# Patient Record
Sex: Female | Born: 1950
Health system: Southern US, Community
[De-identification: ages and names within clinical notes are randomized; demographics above are authoritative.]

## PROBLEM LIST (undated history)

## (undated) DIAGNOSIS — E785 Hyperlipidemia, unspecified: Secondary | ICD-10-CM

## (undated) DIAGNOSIS — Z9289 Personal history of other medical treatment: Secondary | ICD-10-CM

## (undated) DIAGNOSIS — C801 Malignant (primary) neoplasm, unspecified: Secondary | ICD-10-CM

## (undated) DIAGNOSIS — I219 Acute myocardial infarction, unspecified: Secondary | ICD-10-CM

## (undated) DIAGNOSIS — A419 Sepsis, unspecified organism: Secondary | ICD-10-CM

## (undated) DIAGNOSIS — T7840XA Allergy, unspecified, initial encounter: Secondary | ICD-10-CM

## (undated) DIAGNOSIS — I255 Ischemic cardiomyopathy: Secondary | ICD-10-CM

## (undated) DIAGNOSIS — H409 Unspecified glaucoma: Secondary | ICD-10-CM

## (undated) DIAGNOSIS — E119 Type 2 diabetes mellitus without complications: Secondary | ICD-10-CM

## (undated) DIAGNOSIS — I2510 Atherosclerotic heart disease of native coronary artery without angina pectoris: Secondary | ICD-10-CM

## (undated) DIAGNOSIS — M1990 Unspecified osteoarthritis, unspecified site: Secondary | ICD-10-CM

## (undated) DIAGNOSIS — I509 Heart failure, unspecified: Secondary | ICD-10-CM

## (undated) DIAGNOSIS — N189 Chronic kidney disease, unspecified: Secondary | ICD-10-CM

## (undated) DIAGNOSIS — I10 Essential (primary) hypertension: Secondary | ICD-10-CM

## (undated) HISTORY — DX: Unspecified osteoarthritis, unspecified site: M19.90

## (undated) HISTORY — PX: MOHS SURGERY: SUR867

## (undated) HISTORY — DX: Type 2 diabetes mellitus without complications: E11.9

## (undated) HISTORY — DX: Allergy, unspecified, initial encounter: T78.40XA

## (undated) HISTORY — PX: CARPAL TUNNEL RELEASE: SHX101

## (undated) HISTORY — PX: TONSILLECTOMY: SUR1361

## (undated) HISTORY — PX: CATARACT EXTRACTION: SUR2

## (undated) HISTORY — PX: APPENDECTOMY: SHX54

## (undated) HISTORY — DX: Unspecified glaucoma: H40.9

## (undated) HISTORY — PX: BREAST BIOPSY: SHX20

---

## 2004-11-10 ENCOUNTER — Ambulatory Visit: Payer: Self-pay | Admitting: Unknown Physician Specialty

## 2005-11-16 ENCOUNTER — Ambulatory Visit: Payer: Self-pay | Admitting: Unknown Physician Specialty

## 2007-01-17 ENCOUNTER — Ambulatory Visit: Payer: Self-pay | Admitting: Unknown Physician Specialty

## 2007-03-09 ENCOUNTER — Encounter: Payer: Self-pay | Admitting: Orthopedic Surgery

## 2007-03-16 ENCOUNTER — Encounter: Payer: Self-pay | Admitting: Orthopedic Surgery

## 2007-04-16 ENCOUNTER — Encounter: Payer: Self-pay | Admitting: Orthopedic Surgery

## 2007-05-16 ENCOUNTER — Encounter: Payer: Self-pay | Admitting: Orthopedic Surgery

## 2007-12-26 ENCOUNTER — Ambulatory Visit: Payer: Self-pay | Admitting: Ophthalmology

## 2008-01-04 ENCOUNTER — Encounter: Payer: Self-pay | Admitting: General Practice

## 2008-01-14 ENCOUNTER — Encounter: Payer: Self-pay | Admitting: General Practice

## 2008-02-06 ENCOUNTER — Inpatient Hospital Stay: Payer: Self-pay | Admitting: Internal Medicine

## 2008-02-06 ENCOUNTER — Other Ambulatory Visit: Payer: Self-pay

## 2008-02-14 ENCOUNTER — Encounter: Payer: Self-pay | Admitting: General Practice

## 2008-03-14 ENCOUNTER — Encounter: Payer: Self-pay | Admitting: Internal Medicine

## 2008-03-15 ENCOUNTER — Encounter: Payer: Self-pay | Admitting: Internal Medicine

## 2008-04-15 ENCOUNTER — Encounter: Payer: Self-pay | Admitting: Internal Medicine

## 2008-05-15 ENCOUNTER — Encounter: Payer: Self-pay | Admitting: Internal Medicine

## 2008-05-31 ENCOUNTER — Ambulatory Visit: Payer: Self-pay | Admitting: Internal Medicine

## 2009-06-20 ENCOUNTER — Ambulatory Visit: Payer: Self-pay | Admitting: Internal Medicine

## 2010-06-24 ENCOUNTER — Ambulatory Visit: Payer: Self-pay | Admitting: Internal Medicine

## 2011-06-26 ENCOUNTER — Encounter (INDEPENDENT_AMBULATORY_CARE_PROVIDER_SITE_OTHER): Payer: BC Managed Care – PPO | Admitting: Ophthalmology

## 2011-06-26 DIAGNOSIS — E1139 Type 2 diabetes mellitus with other diabetic ophthalmic complication: Secondary | ICD-10-CM

## 2011-06-26 DIAGNOSIS — H43819 Vitreous degeneration, unspecified eye: Secondary | ICD-10-CM

## 2011-06-26 DIAGNOSIS — H35379 Puckering of macula, unspecified eye: Secondary | ICD-10-CM

## 2011-06-26 DIAGNOSIS — E11359 Type 2 diabetes mellitus with proliferative diabetic retinopathy without macular edema: Secondary | ICD-10-CM

## 2011-08-26 ENCOUNTER — Ambulatory Visit: Payer: Self-pay | Admitting: Internal Medicine

## 2011-12-16 ENCOUNTER — Ambulatory Visit (INDEPENDENT_AMBULATORY_CARE_PROVIDER_SITE_OTHER): Payer: BC Managed Care – PPO | Admitting: Ophthalmology

## 2012-06-15 HISTORY — PX: COLONOSCOPY: SHX174

## 2012-10-07 ENCOUNTER — Ambulatory Visit: Payer: Self-pay | Admitting: Internal Medicine

## 2013-06-15 HISTORY — PX: CARDIAC CATHETERIZATION: SHX172

## 2013-11-01 ENCOUNTER — Ambulatory Visit: Payer: Self-pay | Admitting: Internal Medicine

## 2014-03-15 ENCOUNTER — Ambulatory Visit: Payer: Self-pay | Admitting: Cardiology

## 2014-10-17 ENCOUNTER — Other Ambulatory Visit: Payer: Self-pay

## 2014-10-17 DIAGNOSIS — Z1231 Encounter for screening mammogram for malignant neoplasm of breast: Secondary | ICD-10-CM

## 2014-11-06 ENCOUNTER — Ambulatory Visit
Admission: RE | Admit: 2014-11-06 | Discharge: 2014-11-06 | Disposition: A | Discharge status: Home / Self Care | Payer: PPO | Source: Ambulatory Visit | Attending: Internal Medicine | Admitting: Internal Medicine

## 2014-11-06 DIAGNOSIS — Z1231 Encounter for screening mammogram for malignant neoplasm of breast: Secondary | ICD-10-CM

## 2014-11-06 HISTORY — DX: Malignant (primary) neoplasm, unspecified: C80.1

## 2014-11-16 ENCOUNTER — Telehealth: Payer: Self-pay | Admitting: Radiology

## 2015-07-05 DIAGNOSIS — E1169 Type 2 diabetes mellitus with other specified complication: Secondary | ICD-10-CM | POA: Diagnosis not present

## 2015-07-05 DIAGNOSIS — E785 Hyperlipidemia, unspecified: Secondary | ICD-10-CM | POA: Diagnosis not present

## 2015-07-05 DIAGNOSIS — I10 Essential (primary) hypertension: Secondary | ICD-10-CM | POA: Diagnosis not present

## 2015-07-05 DIAGNOSIS — Z9889 Other specified postprocedural states: Secondary | ICD-10-CM | POA: Diagnosis not present

## 2015-07-05 DIAGNOSIS — I2510 Atherosclerotic heart disease of native coronary artery without angina pectoris: Secondary | ICD-10-CM | POA: Diagnosis not present

## 2015-07-05 DIAGNOSIS — I5021 Acute systolic (congestive) heart failure: Secondary | ICD-10-CM | POA: Diagnosis not present

## 2015-07-05 DIAGNOSIS — E1122 Type 2 diabetes mellitus with diabetic chronic kidney disease: Secondary | ICD-10-CM | POA: Diagnosis not present

## 2015-07-05 DIAGNOSIS — N183 Chronic kidney disease, stage 3 (moderate): Secondary | ICD-10-CM | POA: Diagnosis not present

## 2015-07-16 DIAGNOSIS — I2510 Atherosclerotic heart disease of native coronary artery without angina pectoris: Secondary | ICD-10-CM | POA: Diagnosis not present

## 2015-07-16 DIAGNOSIS — N183 Chronic kidney disease, stage 3 (moderate): Secondary | ICD-10-CM | POA: Diagnosis not present

## 2015-07-16 DIAGNOSIS — E1122 Type 2 diabetes mellitus with diabetic chronic kidney disease: Secondary | ICD-10-CM | POA: Diagnosis not present

## 2015-07-23 DIAGNOSIS — E1122 Type 2 diabetes mellitus with diabetic chronic kidney disease: Secondary | ICD-10-CM | POA: Diagnosis not present

## 2015-07-23 DIAGNOSIS — I10 Essential (primary) hypertension: Secondary | ICD-10-CM | POA: Diagnosis not present

## 2015-07-23 DIAGNOSIS — E785 Hyperlipidemia, unspecified: Secondary | ICD-10-CM | POA: Diagnosis not present

## 2015-07-23 DIAGNOSIS — I5022 Chronic systolic (congestive) heart failure: Secondary | ICD-10-CM | POA: Diagnosis not present

## 2015-07-23 DIAGNOSIS — N183 Chronic kidney disease, stage 3 (moderate): Secondary | ICD-10-CM | POA: Diagnosis not present

## 2015-07-23 DIAGNOSIS — E1169 Type 2 diabetes mellitus with other specified complication: Secondary | ICD-10-CM | POA: Diagnosis not present

## 2015-08-08 DIAGNOSIS — L570 Actinic keratosis: Secondary | ICD-10-CM | POA: Diagnosis not present

## 2015-08-08 DIAGNOSIS — L578 Other skin changes due to chronic exposure to nonionizing radiation: Secondary | ICD-10-CM | POA: Diagnosis not present

## 2015-08-08 DIAGNOSIS — Z85828 Personal history of other malignant neoplasm of skin: Secondary | ICD-10-CM | POA: Diagnosis not present

## 2015-08-27 DIAGNOSIS — R6889 Other general symptoms and signs: Secondary | ICD-10-CM | POA: Diagnosis not present

## 2015-09-10 DIAGNOSIS — L570 Actinic keratosis: Secondary | ICD-10-CM | POA: Diagnosis not present

## 2015-10-23 DIAGNOSIS — L570 Actinic keratosis: Secondary | ICD-10-CM | POA: Diagnosis not present

## 2015-10-23 DIAGNOSIS — L718 Other rosacea: Secondary | ICD-10-CM | POA: Diagnosis not present

## 2015-10-23 DIAGNOSIS — Z85828 Personal history of other malignant neoplasm of skin: Secondary | ICD-10-CM | POA: Diagnosis not present

## 2015-10-23 DIAGNOSIS — L578 Other skin changes due to chronic exposure to nonionizing radiation: Secondary | ICD-10-CM | POA: Diagnosis not present

## 2015-10-24 ENCOUNTER — Other Ambulatory Visit: Payer: Self-pay | Admitting: Internal Medicine

## 2015-10-24 DIAGNOSIS — Z1231 Encounter for screening mammogram for malignant neoplasm of breast: Secondary | ICD-10-CM

## 2015-10-29 DIAGNOSIS — E113593 Type 2 diabetes mellitus with proliferative diabetic retinopathy without macular edema, bilateral: Secondary | ICD-10-CM | POA: Diagnosis not present

## 2015-11-07 ENCOUNTER — Ambulatory Visit
Admission: RE | Admit: 2015-11-07 | Discharge: 2015-11-07 | Disposition: A | Discharge status: Home / Self Care | Payer: PPO | Source: Ambulatory Visit | Attending: Internal Medicine | Admitting: Internal Medicine

## 2015-11-07 DIAGNOSIS — Z1231 Encounter for screening mammogram for malignant neoplasm of breast: Secondary | ICD-10-CM

## 2015-11-14 DIAGNOSIS — N183 Chronic kidney disease, stage 3 (moderate): Secondary | ICD-10-CM | POA: Diagnosis not present

## 2015-11-14 DIAGNOSIS — E1169 Type 2 diabetes mellitus with other specified complication: Secondary | ICD-10-CM | POA: Diagnosis not present

## 2015-11-14 DIAGNOSIS — E1122 Type 2 diabetes mellitus with diabetic chronic kidney disease: Secondary | ICD-10-CM | POA: Diagnosis not present

## 2015-11-14 DIAGNOSIS — E785 Hyperlipidemia, unspecified: Secondary | ICD-10-CM | POA: Diagnosis not present

## 2015-11-21 DIAGNOSIS — E785 Hyperlipidemia, unspecified: Secondary | ICD-10-CM | POA: Diagnosis not present

## 2015-11-21 DIAGNOSIS — E1169 Type 2 diabetes mellitus with other specified complication: Secondary | ICD-10-CM | POA: Diagnosis not present

## 2015-11-21 DIAGNOSIS — N183 Chronic kidney disease, stage 3 (moderate): Secondary | ICD-10-CM | POA: Diagnosis not present

## 2015-11-21 DIAGNOSIS — E1122 Type 2 diabetes mellitus with diabetic chronic kidney disease: Secondary | ICD-10-CM | POA: Diagnosis not present

## 2015-11-21 DIAGNOSIS — I10 Essential (primary) hypertension: Secondary | ICD-10-CM | POA: Diagnosis not present

## 2015-11-21 DIAGNOSIS — I2510 Atherosclerotic heart disease of native coronary artery without angina pectoris: Secondary | ICD-10-CM | POA: Diagnosis not present

## 2015-11-21 DIAGNOSIS — I5022 Chronic systolic (congestive) heart failure: Secondary | ICD-10-CM | POA: Diagnosis not present

## 2015-11-25 DIAGNOSIS — E113593 Type 2 diabetes mellitus with proliferative diabetic retinopathy without macular edema, bilateral: Secondary | ICD-10-CM | POA: Diagnosis not present

## 2015-12-11 DIAGNOSIS — L570 Actinic keratosis: Secondary | ICD-10-CM | POA: Diagnosis not present

## 2016-01-09 DIAGNOSIS — E785 Hyperlipidemia, unspecified: Secondary | ICD-10-CM | POA: Diagnosis not present

## 2016-01-09 DIAGNOSIS — N183 Chronic kidney disease, stage 3 (moderate): Secondary | ICD-10-CM | POA: Diagnosis not present

## 2016-01-09 DIAGNOSIS — Z9889 Other specified postprocedural states: Secondary | ICD-10-CM | POA: Diagnosis not present

## 2016-01-09 DIAGNOSIS — I10 Essential (primary) hypertension: Secondary | ICD-10-CM | POA: Diagnosis not present

## 2016-01-09 DIAGNOSIS — E1169 Type 2 diabetes mellitus with other specified complication: Secondary | ICD-10-CM | POA: Diagnosis not present

## 2016-01-09 DIAGNOSIS — I2510 Atherosclerotic heart disease of native coronary artery without angina pectoris: Secondary | ICD-10-CM | POA: Diagnosis not present

## 2016-01-09 DIAGNOSIS — E1122 Type 2 diabetes mellitus with diabetic chronic kidney disease: Secondary | ICD-10-CM | POA: Diagnosis not present

## 2016-01-09 DIAGNOSIS — I5022 Chronic systolic (congestive) heart failure: Secondary | ICD-10-CM | POA: Diagnosis not present

## 2016-01-23 DIAGNOSIS — L820 Inflamed seborrheic keratosis: Secondary | ICD-10-CM | POA: Diagnosis not present

## 2016-01-23 DIAGNOSIS — L570 Actinic keratosis: Secondary | ICD-10-CM | POA: Diagnosis not present

## 2016-01-23 DIAGNOSIS — D1800 Hemangioma unspecified site: Secondary | ICD-10-CM | POA: Diagnosis not present

## 2016-01-23 DIAGNOSIS — L821 Other seborrheic keratosis: Secondary | ICD-10-CM | POA: Diagnosis not present

## 2016-01-23 DIAGNOSIS — Z85828 Personal history of other malignant neoplasm of skin: Secondary | ICD-10-CM | POA: Diagnosis not present

## 2016-01-23 DIAGNOSIS — L578 Other skin changes due to chronic exposure to nonionizing radiation: Secondary | ICD-10-CM | POA: Diagnosis not present

## 2016-01-29 ENCOUNTER — Ambulatory Visit
Admission: RE | Admit: 2016-01-29 | Discharge: 2016-01-29 | Disposition: A | Discharge status: Home / Self Care | Payer: PPO | Source: Ambulatory Visit | Attending: Internal Medicine | Admitting: Internal Medicine

## 2016-01-29 ENCOUNTER — Other Ambulatory Visit: Payer: Self-pay | Admitting: Internal Medicine

## 2016-01-29 DIAGNOSIS — I639 Cerebral infarction, unspecified: Secondary | ICD-10-CM | POA: Diagnosis not present

## 2016-01-29 DIAGNOSIS — R270 Ataxia, unspecified: Secondary | ICD-10-CM | POA: Diagnosis not present

## 2016-01-29 DIAGNOSIS — G319 Degenerative disease of nervous system, unspecified: Secondary | ICD-10-CM | POA: Insufficient documentation

## 2016-01-29 DIAGNOSIS — I638 Other cerebral infarction: Secondary | ICD-10-CM | POA: Diagnosis not present

## 2016-02-27 DIAGNOSIS — L570 Actinic keratosis: Secondary | ICD-10-CM | POA: Diagnosis not present

## 2016-04-28 DIAGNOSIS — H401134 Primary open-angle glaucoma, bilateral, indeterminate stage: Secondary | ICD-10-CM | POA: Diagnosis not present

## 2016-04-29 DIAGNOSIS — L578 Other skin changes due to chronic exposure to nonionizing radiation: Secondary | ICD-10-CM | POA: Diagnosis not present

## 2016-04-29 DIAGNOSIS — Z85828 Personal history of other malignant neoplasm of skin: Secondary | ICD-10-CM | POA: Diagnosis not present

## 2016-04-29 DIAGNOSIS — L812 Freckles: Secondary | ICD-10-CM | POA: Diagnosis not present

## 2016-04-29 DIAGNOSIS — L570 Actinic keratosis: Secondary | ICD-10-CM | POA: Diagnosis not present

## 2016-04-29 DIAGNOSIS — D1800 Hemangioma unspecified site: Secondary | ICD-10-CM | POA: Diagnosis not present

## 2016-04-29 DIAGNOSIS — L299 Pruritus, unspecified: Secondary | ICD-10-CM | POA: Diagnosis not present

## 2016-05-05 DIAGNOSIS — H401134 Primary open-angle glaucoma, bilateral, indeterminate stage: Secondary | ICD-10-CM | POA: Diagnosis not present

## 2016-05-12 DIAGNOSIS — H18002 Unspecified corneal deposit, left eye: Secondary | ICD-10-CM | POA: Diagnosis not present

## 2016-05-15 DIAGNOSIS — E113593 Type 2 diabetes mellitus with proliferative diabetic retinopathy without macular edema, bilateral: Secondary | ICD-10-CM | POA: Diagnosis not present

## 2016-05-26 DIAGNOSIS — H18002 Unspecified corneal deposit, left eye: Secondary | ICD-10-CM | POA: Diagnosis not present

## 2016-05-28 DIAGNOSIS — J069 Acute upper respiratory infection, unspecified: Secondary | ICD-10-CM | POA: Diagnosis not present

## 2016-05-28 DIAGNOSIS — E1122 Type 2 diabetes mellitus with diabetic chronic kidney disease: Secondary | ICD-10-CM | POA: Diagnosis not present

## 2016-05-28 DIAGNOSIS — N183 Chronic kidney disease, stage 3 (moderate): Secondary | ICD-10-CM | POA: Diagnosis not present

## 2016-05-28 DIAGNOSIS — I5022 Chronic systolic (congestive) heart failure: Secondary | ICD-10-CM | POA: Diagnosis not present

## 2016-05-29 DIAGNOSIS — L570 Actinic keratosis: Secondary | ICD-10-CM | POA: Diagnosis not present

## 2016-06-01 DIAGNOSIS — R05 Cough: Secondary | ICD-10-CM | POA: Diagnosis not present

## 2016-06-01 DIAGNOSIS — N183 Chronic kidney disease, stage 3 (moderate): Secondary | ICD-10-CM | POA: Diagnosis not present

## 2016-06-01 DIAGNOSIS — J069 Acute upper respiratory infection, unspecified: Secondary | ICD-10-CM | POA: Diagnosis not present

## 2016-06-01 DIAGNOSIS — E1122 Type 2 diabetes mellitus with diabetic chronic kidney disease: Secondary | ICD-10-CM | POA: Diagnosis not present

## 2016-06-03 DIAGNOSIS — I2510 Atherosclerotic heart disease of native coronary artery without angina pectoris: Secondary | ICD-10-CM | POA: Diagnosis not present

## 2016-06-03 DIAGNOSIS — E785 Hyperlipidemia, unspecified: Secondary | ICD-10-CM | POA: Diagnosis not present

## 2016-06-03 DIAGNOSIS — E1122 Type 2 diabetes mellitus with diabetic chronic kidney disease: Secondary | ICD-10-CM | POA: Diagnosis not present

## 2016-06-03 DIAGNOSIS — N183 Chronic kidney disease, stage 3 (moderate): Secondary | ICD-10-CM | POA: Diagnosis not present

## 2016-06-03 DIAGNOSIS — E1169 Type 2 diabetes mellitus with other specified complication: Secondary | ICD-10-CM | POA: Diagnosis not present

## 2016-06-03 DIAGNOSIS — I5022 Chronic systolic (congestive) heart failure: Secondary | ICD-10-CM | POA: Diagnosis not present

## 2016-06-10 DIAGNOSIS — E1169 Type 2 diabetes mellitus with other specified complication: Secondary | ICD-10-CM | POA: Diagnosis not present

## 2016-06-10 DIAGNOSIS — Z0000 Encounter for general adult medical examination without abnormal findings: Secondary | ICD-10-CM | POA: Diagnosis not present

## 2016-06-10 DIAGNOSIS — E1122 Type 2 diabetes mellitus with diabetic chronic kidney disease: Secondary | ICD-10-CM | POA: Diagnosis not present

## 2016-06-10 DIAGNOSIS — E785 Hyperlipidemia, unspecified: Secondary | ICD-10-CM | POA: Diagnosis not present

## 2016-06-10 DIAGNOSIS — I2510 Atherosclerotic heart disease of native coronary artery without angina pectoris: Secondary | ICD-10-CM | POA: Diagnosis not present

## 2016-06-10 DIAGNOSIS — I10 Essential (primary) hypertension: Secondary | ICD-10-CM | POA: Diagnosis not present

## 2016-06-10 DIAGNOSIS — N183 Chronic kidney disease, stage 3 (moderate): Secondary | ICD-10-CM | POA: Diagnosis not present

## 2016-06-10 DIAGNOSIS — I5022 Chronic systolic (congestive) heart failure: Secondary | ICD-10-CM | POA: Diagnosis not present

## 2016-06-24 ENCOUNTER — Inpatient Hospital Stay: Payer: PPO | Attending: Hematology and Oncology | Admitting: Hematology and Oncology

## 2016-06-24 ENCOUNTER — Encounter: Payer: Self-pay | Admitting: Hematology and Oncology

## 2016-06-24 VITALS — BP 138/81 | HR 73 | Temp 97.7°F | Resp 18 | Ht <= 58 in | Wt 146.2 lb

## 2016-06-24 DIAGNOSIS — H409 Unspecified glaucoma: Secondary | ICD-10-CM | POA: Diagnosis not present

## 2016-06-24 DIAGNOSIS — E119 Type 2 diabetes mellitus without complications: Secondary | ICD-10-CM | POA: Diagnosis not present

## 2016-06-24 DIAGNOSIS — Z7984 Long term (current) use of oral hypoglycemic drugs: Secondary | ICD-10-CM | POA: Insufficient documentation

## 2016-06-24 DIAGNOSIS — D039 Melanoma in situ, unspecified: Secondary | ICD-10-CM | POA: Diagnosis not present

## 2016-06-24 DIAGNOSIS — Z85820 Personal history of malignant melanoma of skin: Secondary | ICD-10-CM | POA: Diagnosis not present

## 2016-06-24 DIAGNOSIS — C4441 Basal cell carcinoma of skin of scalp and neck: Secondary | ICD-10-CM | POA: Insufficient documentation

## 2016-06-24 NOTE — Progress Notes (Signed)
Patient here today as new evaluation regarding basal cell carcinoma of scalp.  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  She also has glaucoma.  She further states she has non-healing wound on her scalp, moles on her body.  She also has dizziness and falls.

## 2016-06-24 NOTE — Progress Notes (Signed)
[facility] day:  06/24/2016  Chief Complaint: IRHA Erin Mccoy is a 66 y.o. female with basal cell carcinoma who is referred in consultation by Dr. Frazier Richards for assessment and management.  HPI:  She has had multiple scalp lesions including squamous cell carcinoma, malignant melanoma in situ, and basal cell carcinoma.  She states that she has been followed by dermatology for 12 years and has seen 4 different dermatologist.    She was diagnosed with right parietal squamous cell carcinoma.  She underwent excision on 08/26/2004 and 09/02/2004.  Right parietal scalp lesion on 03/19/2005 revealed superficial basal cell carcinoma.  She has been managed with Moh's surgery by Dr. Onalee Hua at [facility].  Left vertex biopsy on 03/08/2009 revealed atypical melanocytic proliferation.  Additional tissue on 06/12/2009 revealed malignant melanoma in situ.  Notes are available from Dr. Lacinda Axon.  She was seen by Dr. Lacinda Axon on 09/27/2009 for wound care.  She had an area on her scalp which was healing by secondary intention.  The wound was nearly epithelialized.  Follow-up on 10/22/2009 revealed scalp wound had healed nicely with no suggestion of persistent malignant melanoma in situ.  Right parietal scalp biopsy on 03/05/2010 revealed actinic keratosis.  At the crown of the scalp was squamous cell carcinoma in situ.  She was seen by Dr. Onalee Hua at [facility] on 05/02/2010.  Shave skin biopsy of the anterior crown on 02/27/2015 revealed squamous cell carcinoma in situ, base involved, ulcerated and crusted.  She has been followed by [facility] by Dr. Sarina Ser.  She has also seen Dr Keitha Butte and Dr Nicole Kindred.  She has received cryotherapy and "blue light therapy".  She has used creams.  She continues to have a non-healing scalp.  Nothing has been effective.  Her scalp is non-painful and is rarely pruritic.  She states that she can rub  the area and it "busts open".  Symptomatically, she feels "fine".  She denies any fevers, sweats or weight loss. She is up to date on her mammogram and colonoscopy.   Past Medical History:  Diagnosis Date  . Allergy   . Arthritis   . Cancer ([place])    skin ca on scalp  . Diabetes mellitus without complication ([place])   . Glaucoma     Past Surgical History:  Procedure Laterality Date  . APPENDECTOMY    . BREAST BIOPSY Left    neg  . COLONOSCOPY  2014    Family History  Problem Relation Age of Onset  . Breast cancer Mother   . Breast cancer Maternal Aunt   . Breast cancer Cousin     Social History:  has no tobacco, alcohol, and drug history on file.  She lives in [place].  She has been married for 40 years.  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  The patient is accompanied by her husband, Richardson Landry, today.  Allergies: Not on File  Current Medications: Current Outpatient Prescriptions  Medication Sig Dispense Refill  . lisinopril (PRINIVIL,ZESTRIL) 10 MG tablet Take 10 mg by mouth daily.    . metoprolol succinate (TOPROL-XL) 25 MG 24 hr tablet Take 25 mg by mouth daily.    . pravastatin (PRAVACHOL) 40 MG tablet Take 40 mg by mouth daily.    Marland Kitchen torsemide (DEMADEX) 10 MG tablet Take 10 mg by mouth daily.    . Travoprost, BAK Free, (TRAVATAN Z) 0.004 %  SOLN ophthalmic solution Place 1 drop into both eyes at bedtime.    . COMBIGAN 0.2-0.5 % ophthalmic solution Place 0.2-0.5 drops into both eyes 2 (two) times daily.    Marland Kitchen glimepiride (AMARYL) 1 MG tablet Take 1 mg by mouth daily.    . metFORMIN (GLUCOPHAGE) 1000 MG tablet Take 1,000 mg by mouth 2 (two) times daily.     No current facility-administered medications for this visit.     Review of Systems:  GENERAL:  Feels "fine".  No fevers, sweats or weight loss. PERFORMANCE STATUS (ECOG):  1 HEENT:  No visual changes, runny nose, sore throat, mouth sores or tenderness. Lungs: No shortness  of breath or cough.  No hemoptysis. Cardiac:  No chest pain, palpitations, orthopnea, or PND. GI:  No nausea, vomiting, diarrhea, constipation, melena or hematochezia. Colonoscopy up-to-date. GU:  No urgency, frequency, dysuria, or hematuria. Musculoskeletal:  No back pain.  No joint pain.  No muscle tenderness. Extremities:  No pain or swelling. Skin:  Chronic issues with scalp, non-healing (see HPI). Neurologic:  Dizzy at times.  No headache, numbness, weakness, balance or coordination issues. Endocrine:  Diabetes.  No thyroid issues, hot flashes or night sweats. Psych:  No mood changes, depression or anxiety. Pain:  No focal pain. Review of systems:  All other systems reviewed and found to be negative.  Physical Exam: Blood pressure 138/81, pulse 73, temperature 97.7 F (36.5 C), temperature source Tympanic, resp. rate 18, height 4' 9.5" (1.461 m), weight 146 lb 2.6 oz (66.3 kg). GENERAL:  Well developed, well nourished, woman sitting comfortably in the exam room in no acute distress. MENTAL STATUS:  Alert and oriented to person, place and time. HEAD:  Short gray hair.  Large area of alopecia on top of scalp with a 3-4 cm area of thick honey crusting.  Normocephalic, atraumatic, face symmetric, no Cushingoid features. EYES:  Silver glasses.  Blue eyes.  Pupils equal round and reactive to light and accomodation.  No conjunctivitis or scleral icterus. ENT:  Oropharynx clear without lesion.  Tongue normal. Mucous membranes moist.  RESPIRATORY:  Clear to auscultation without rales, wheezes or rhonchi. CARDIOVASCULAR:  Regular rate and rhythm without murmur, rub or gallop. ABDOMEN:  Soft, non-tender, with active bowel sounds, and no hepatosplenomegaly.  No masses. SKIN:  No moles or atypical lesions.  Scalp as noted above.  No rashes, ulcers or lesions. EXTREMITIES: No edema, no skin discoloration or tenderness.  No palpable cords. LYMPH NODES: No palpable cervical, supraclavicular, axillary  or inguinal adenopathy  NEUROLOGICAL: Unremarkable. PSYCH:  Appropriate.   No visits with results within 3 Day(s) from this visit.  Latest known visit with results is:  No results found for any previous visit.    Assessment:  NIKEMA WILKERSON is a 66 y.o. female with a history of squamous cell carcinoma, malignant melanoma in situ, and basal cell carcinoma of the scalp s/p multiple excisions and Moh's surgery.  She has a non-healing area on the top of vertex of her scalp.  She has been treated with creams and a "blue light" unsuccessfully.  Symptomatically, she denies any other symptoms.  Exam reveals a 3-4 cm thick honey crusted area on the vertex of her scalp.  Plan: 1.  Discuss diagnosis and management of skin cancers.  Patient has undergone resection.  There is no current evidence of active malignancy.  Per her report, last biopsy was "pre-cancerous".  She has a non-healing area of skin.  Discussed medical photos.  Discuss review  findings with Dr. Onalee Hua at [facility] who she has seen in the past. 2.  Medical photographs. 3.  Phone follow-up with Dr. Lacinda Axon- Moh's surgeon at [facility] [phone]). 4.  RTC in 1 month for MD assessment.   Lequita Asal, MD  06/24/2016, 3:24 PM

## 2016-06-26 ENCOUNTER — Telehealth: Payer: Self-pay | Admitting: *Deleted

## 2016-06-26 DIAGNOSIS — Z9889 Other specified postprocedural states: Secondary | ICD-10-CM | POA: Diagnosis not present

## 2016-06-26 DIAGNOSIS — I10 Essential (primary) hypertension: Secondary | ICD-10-CM | POA: Diagnosis not present

## 2016-06-26 DIAGNOSIS — E1169 Type 2 diabetes mellitus with other specified complication: Secondary | ICD-10-CM | POA: Diagnosis not present

## 2016-06-26 DIAGNOSIS — I5022 Chronic systolic (congestive) heart failure: Secondary | ICD-10-CM | POA: Diagnosis not present

## 2016-06-26 DIAGNOSIS — E785 Hyperlipidemia, unspecified: Secondary | ICD-10-CM | POA: Diagnosis not present

## 2016-06-26 DIAGNOSIS — I2510 Atherosclerotic heart disease of native coronary artery without angina pectoris: Secondary | ICD-10-CM | POA: Diagnosis not present

## 2016-06-26 NOTE — Telephone Encounter (Signed)
Called patient and spoke with husband about coming next Tuesday for medical photos in order to send to [facility] surgeon at [facility].  Voiced understanding.

## 2016-06-30 ENCOUNTER — Telehealth: Payer: Self-pay | Admitting: *Deleted

## 2016-06-30 DIAGNOSIS — H18002 Unspecified corneal deposit, left eye: Secondary | ICD-10-CM | POA: Diagnosis not present

## 2016-06-30 NOTE — Telephone Encounter (Signed)
Called patient to verify that she would be here at the cancer center today at 1430 for pictures of her scalp for dr. Mike Gip to get with MD at [facility] for consult. Voiced understanding and agreement.

## 2016-07-14 DIAGNOSIS — I5022 Chronic systolic (congestive) heart failure: Secondary | ICD-10-CM | POA: Diagnosis not present

## 2016-07-14 DIAGNOSIS — I2510 Atherosclerotic heart disease of native coronary artery without angina pectoris: Secondary | ICD-10-CM | POA: Diagnosis not present

## 2016-07-17 DIAGNOSIS — I10 Essential (primary) hypertension: Secondary | ICD-10-CM | POA: Diagnosis not present

## 2016-07-17 DIAGNOSIS — N183 Chronic kidney disease, stage 3 (moderate): Secondary | ICD-10-CM | POA: Diagnosis not present

## 2016-07-17 DIAGNOSIS — E1122 Type 2 diabetes mellitus with diabetic chronic kidney disease: Secondary | ICD-10-CM | POA: Diagnosis not present

## 2016-07-17 DIAGNOSIS — E1169 Type 2 diabetes mellitus with other specified complication: Secondary | ICD-10-CM | POA: Diagnosis not present

## 2016-07-17 DIAGNOSIS — I2510 Atherosclerotic heart disease of native coronary artery without angina pectoris: Secondary | ICD-10-CM | POA: Diagnosis not present

## 2016-07-17 DIAGNOSIS — Z9889 Other specified postprocedural states: Secondary | ICD-10-CM | POA: Diagnosis not present

## 2016-07-17 DIAGNOSIS — E785 Hyperlipidemia, unspecified: Secondary | ICD-10-CM | POA: Diagnosis not present

## 2016-07-17 DIAGNOSIS — I5022 Chronic systolic (congestive) heart failure: Secondary | ICD-10-CM | POA: Diagnosis not present

## 2016-07-28 DIAGNOSIS — H18002 Unspecified corneal deposit, left eye: Secondary | ICD-10-CM | POA: Diagnosis not present

## 2016-07-29 ENCOUNTER — Inpatient Hospital Stay: Payer: PPO | Attending: Hematology and Oncology | Admitting: Hematology and Oncology

## 2016-07-29 ENCOUNTER — Encounter: Payer: Self-pay | Admitting: Hematology and Oncology

## 2016-07-29 DIAGNOSIS — E119 Type 2 diabetes mellitus without complications: Secondary | ICD-10-CM | POA: Diagnosis not present

## 2016-07-29 DIAGNOSIS — H409 Unspecified glaucoma: Secondary | ICD-10-CM | POA: Insufficient documentation

## 2016-07-29 DIAGNOSIS — Z7982 Long term (current) use of aspirin: Secondary | ICD-10-CM | POA: Diagnosis not present

## 2016-07-29 DIAGNOSIS — C4441 Basal cell carcinoma of skin of scalp and neck: Secondary | ICD-10-CM

## 2016-07-29 DIAGNOSIS — Z7984 Long term (current) use of oral hypoglycemic drugs: Secondary | ICD-10-CM | POA: Diagnosis not present

## 2016-07-29 DIAGNOSIS — Z85820 Personal history of malignant melanoma of skin: Secondary | ICD-10-CM

## 2016-07-29 NOTE — Progress Notes (Signed)
[facility] day:  07/29/2016  Chief Complaint: Erin Mccoy is a 66 y.o. female with a history of multiple scalp skin cancers who seen for 1 month assessment.  HPI:  The patient was last seen in the medical oncology clinic on 06/24/2016.  At that time she was seen for initial consultation regarding basal cell carcinoma. She had a history of a non-healing scalp following squamous cell carcinoma, malignant melanoma in situ, and basal cell carcinoma.  She was s/p multiple excisions and Moh's surgery.  She was receiving creams and "blue light " therapy.  Last biopsy revealed a precancerous lesion.  She had no active malignancy.  Exam revealed a large area of alopecia and a 3-4 cm area of crusting.  Medical photographs were taken.  I spoke to Dr. Onalee Mccoy at [facility].  He had not seen her in 7 years.  At his last assessment, she had a thick adherent scale.  Recommendations included shaving the area and utilizing keratolytic cream.  She had seen several dermatologist.  This was felt to be a chronic problem that required long term management of the scalp.  It was felt that she needed to continue care with one dermatologist.  During the interim, she has had ongoing problems with her scalp.  She has a new place on her scalp anteriorly.   Past Medical History:  Diagnosis Date  . Allergy   . Arthritis   . Cancer ([place])    skin ca on scalp  . Diabetes mellitus without complication ([place])   . Glaucoma     Past Surgical History:  Procedure Laterality Date  . APPENDECTOMY    . BREAST BIOPSY Left    neg  . COLONOSCOPY  2014    Family History  Problem Relation Age of Onset  . Breast cancer Mother   . Breast cancer Maternal Aunt   . Breast cancer Cousin     Social History:  has no tobacco, alcohol, and drug history on file.  She lives in [place].  She has been married for 40 years.  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  The patient is accompanied by her husband, Erin Mccoy, today.  Allergies: Not on File  Current Medications: Current Outpatient Prescriptions  Medication Sig Dispense Refill  . aspirin EC 81 MG tablet Take 81 mg by mouth daily.    . COMBIGAN 0.2-0.5 % ophthalmic solution Place 0.2-0.5 drops into both eyes 2 (two) times daily.    Marland Kitchen glimepiride (AMARYL) 1 MG tablet Take 1 mg by mouth daily.    Marland Kitchen lisinopril (PRINIVIL,ZESTRIL) 10 MG tablet Take 10 mg by mouth daily.    . metFORMIN (GLUCOPHAGE) 1000 MG tablet Take 1,000 mg by mouth 2 (two) times daily.    . metoprolol succinate (TOPROL-XL) 25 MG 24 hr tablet Take 25 mg by mouth daily.    . pravastatin (PRAVACHOL) 40 MG tablet Take 40 mg by mouth daily.    Marland Kitchen torsemide (DEMADEX) 10 MG tablet Take 10 mg by mouth 2 (two) times daily.    . Travoprost, BAK Free, (TRAVATAN Z) 0.004 % SOLN ophthalmic solution Place 1 drop into both eyes at bedtime.     No current facility-administered medications for this visit.     Review of Systems:  GENERAL:  Feels "ok".  No fevers or sweats.  Weight down 3 pounds. PERFORMANCE STATUS (ECOG):  1 HEENT:  Macular degeneration.  No visual changes, runny nose, sore throat, mouth sores or tenderness. Lungs: No shortness of breath or cough.  No hemoptysis. Cardiac:  No chest pain, palpitations, orthopnea, or PND. GI:  No nausea, vomiting, diarrhea, constipation, melena or hematochezia. Colonoscopy up-to-date. GU:  No urgency, frequency, dysuria, or hematuria. Musculoskeletal:  No back pain.  No joint pain.  No muscle tenderness. Extremities:  Swelling.  No pain. Skin:  No rashes or skin changes. Neuro:  No headache, numbness or weakness, balance or coordination issues. Endocrine:  Diabetes.  No thyroid issues, hot flashes or night sweats. Psych:  No mood changes, depression or anxiety. Pain:  No focal pain. Review of systems:  All other systems reviewed and found to be negative.  Physical Exam: Blood  pressure (!) 150/77, pulse 88, temperature 97.5 F (36.4 C), temperature source Tympanic, resp. rate 18, weight 143 lb 8.3 oz (65.1 kg). GENERAL:  Well developed, well nourished, woman sitting comfortably in the exam room in no acute distress.  She is tearful at times. MENTAL STATUS:  Alert and oriented to person, place and time. HEAD:  Short gray hair.  Large area of alopecia on top of scalp minimal patchy crusting.  Small dark area in hair anterior left. Normocephalic, atraumatic, face symmetric, no Cushingoid features. EYES:  Silver glasses.  Blue eyes.  Eyes injected.  No scleral icterus. NECK:  No palpable adenopathy. NEUROLOGICAL: Unremarkable. PSYCH:  Appropriate.   No visits with results within 3 Day(s) from this visit.  Latest known visit with results is:  No results found for any previous visit.    Assessment:  [name] is a 66 y.o. female with a history of squamous cell carcinoma, malignant melanoma in situ, and basal cell carcinoma of the scalp s/p multiple excisions and Moh's surgery.  She has a non-healing area on the top of vertex of her scalp.  She has been treated with creams and a "blue light" unsuccessfully.  Symptomatically, she denies any other symptoms.  Exam reveals persistent scalp changes.  Plan: 1.  Discuss no current evidence of active malignancy.  Discuss my conversation with Erin Mccoy.  Discuss what is needed is long term management by dermatology.  Patient expressed her frustration.  She has been seeing Erin Mccoy, but does not plan to return to his care. Given her difficult situation, I discussed consideration of referral to a tertiary care center [facility] or [facility]).  They are considering [facility], but may pursue another dermatologist.  I reviewed the importance of sticking with one dermatologist for long term management.    2.  Nurse to contact [facility] referral line to aid in referral. 3.  RTC prn.   Erin Asal, MD  07/29/2016, 2:39 PM

## 2016-07-29 NOTE — Progress Notes (Signed)
Patient here today for follow up regarding basal cell in scalp.  Pictures were taken after last visit.  To be sent to [facility] by Dr. Mike Gip.  Patient here today to discuss findings.

## 2016-07-31 ENCOUNTER — Encounter: Payer: Self-pay | Admitting: Hematology and Oncology

## 2016-08-10 DIAGNOSIS — D485 Neoplasm of uncertain behavior of skin: Secondary | ICD-10-CM | POA: Diagnosis not present

## 2016-08-10 DIAGNOSIS — C44319 Basal cell carcinoma of skin of other parts of face: Secondary | ICD-10-CM | POA: Diagnosis not present

## 2016-08-10 DIAGNOSIS — Z85820 Personal history of malignant melanoma of skin: Secondary | ICD-10-CM | POA: Diagnosis not present

## 2016-08-10 DIAGNOSIS — Z85828 Personal history of other malignant neoplasm of skin: Secondary | ICD-10-CM | POA: Diagnosis not present

## 2016-08-10 DIAGNOSIS — L905 Scar conditions and fibrosis of skin: Secondary | ICD-10-CM | POA: Diagnosis not present

## 2016-08-10 DIAGNOSIS — Z08 Encounter for follow-up examination after completed treatment for malignant neoplasm: Secondary | ICD-10-CM | POA: Diagnosis not present

## 2016-08-10 DIAGNOSIS — L570 Actinic keratosis: Secondary | ICD-10-CM | POA: Diagnosis not present

## 2016-08-26 DIAGNOSIS — C4441 Basal cell carcinoma of skin of scalp and neck: Secondary | ICD-10-CM | POA: Diagnosis not present

## 2016-08-26 DIAGNOSIS — L308 Other specified dermatitis: Secondary | ICD-10-CM | POA: Diagnosis not present

## 2016-09-08 DIAGNOSIS — H2511 Age-related nuclear cataract, right eye: Secondary | ICD-10-CM | POA: Diagnosis not present

## 2016-09-16 DIAGNOSIS — Z48817 Encounter for surgical aftercare following surgery on the skin and subcutaneous tissue: Secondary | ICD-10-CM | POA: Diagnosis not present

## 2016-09-16 DIAGNOSIS — D1801 Hemangioma of skin and subcutaneous tissue: Secondary | ICD-10-CM | POA: Diagnosis not present

## 2016-09-16 DIAGNOSIS — L308 Other specified dermatitis: Secondary | ICD-10-CM | POA: Diagnosis not present

## 2016-10-05 DIAGNOSIS — L538 Other specified erythematous conditions: Secondary | ICD-10-CM | POA: Diagnosis not present

## 2016-10-05 DIAGNOSIS — L918 Other hypertrophic disorders of the skin: Secondary | ICD-10-CM | POA: Diagnosis not present

## 2016-10-05 DIAGNOSIS — L820 Inflamed seborrheic keratosis: Secondary | ICD-10-CM | POA: Diagnosis not present

## 2016-10-05 DIAGNOSIS — Z85828 Personal history of other malignant neoplasm of skin: Secondary | ICD-10-CM | POA: Diagnosis not present

## 2016-10-05 DIAGNOSIS — R208 Other disturbances of skin sensation: Secondary | ICD-10-CM | POA: Diagnosis not present

## 2016-10-05 DIAGNOSIS — L718 Other rosacea: Secondary | ICD-10-CM | POA: Diagnosis not present

## 2016-10-05 DIAGNOSIS — L308 Other specified dermatitis: Secondary | ICD-10-CM | POA: Diagnosis not present

## 2016-10-05 DIAGNOSIS — D485 Neoplasm of uncertain behavior of skin: Secondary | ICD-10-CM | POA: Diagnosis not present

## 2016-10-05 DIAGNOSIS — Z85820 Personal history of malignant melanoma of skin: Secondary | ICD-10-CM | POA: Diagnosis not present

## 2016-10-05 DIAGNOSIS — Z08 Encounter for follow-up examination after completed treatment for malignant neoplasm: Secondary | ICD-10-CM | POA: Diagnosis not present

## 2016-10-09 DIAGNOSIS — N183 Chronic kidney disease, stage 3 (moderate): Secondary | ICD-10-CM | POA: Diagnosis not present

## 2016-10-09 DIAGNOSIS — E1122 Type 2 diabetes mellitus with diabetic chronic kidney disease: Secondary | ICD-10-CM | POA: Diagnosis not present

## 2016-10-09 DIAGNOSIS — Z0000 Encounter for general adult medical examination without abnormal findings: Secondary | ICD-10-CM | POA: Diagnosis not present

## 2016-10-09 DIAGNOSIS — E785 Hyperlipidemia, unspecified: Secondary | ICD-10-CM | POA: Diagnosis not present

## 2016-10-09 DIAGNOSIS — I5022 Chronic systolic (congestive) heart failure: Secondary | ICD-10-CM | POA: Diagnosis not present

## 2016-10-09 DIAGNOSIS — E1169 Type 2 diabetes mellitus with other specified complication: Secondary | ICD-10-CM | POA: Diagnosis not present

## 2016-10-14 DIAGNOSIS — I2510 Atherosclerotic heart disease of native coronary artery without angina pectoris: Secondary | ICD-10-CM | POA: Diagnosis not present

## 2016-10-14 DIAGNOSIS — I5022 Chronic systolic (congestive) heart failure: Secondary | ICD-10-CM | POA: Diagnosis not present

## 2016-10-14 DIAGNOSIS — I10 Essential (primary) hypertension: Secondary | ICD-10-CM | POA: Diagnosis not present

## 2016-10-20 ENCOUNTER — Other Ambulatory Visit: Payer: Self-pay | Admitting: Internal Medicine

## 2016-10-20 DIAGNOSIS — Z1231 Encounter for screening mammogram for malignant neoplasm of breast: Secondary | ICD-10-CM

## 2016-10-21 DIAGNOSIS — H2511 Age-related nuclear cataract, right eye: Secondary | ICD-10-CM | POA: Diagnosis not present

## 2016-10-26 DIAGNOSIS — L308 Other specified dermatitis: Secondary | ICD-10-CM | POA: Diagnosis not present

## 2016-10-26 DIAGNOSIS — D1801 Hemangioma of skin and subcutaneous tissue: Secondary | ICD-10-CM | POA: Diagnosis not present

## 2016-10-27 DIAGNOSIS — I5022 Chronic systolic (congestive) heart failure: Secondary | ICD-10-CM | POA: Diagnosis not present

## 2016-10-27 DIAGNOSIS — N183 Chronic kidney disease, stage 3 (moderate): Secondary | ICD-10-CM | POA: Diagnosis not present

## 2016-10-27 DIAGNOSIS — I10 Essential (primary) hypertension: Secondary | ICD-10-CM | POA: Diagnosis not present

## 2016-10-27 DIAGNOSIS — I2510 Atherosclerotic heart disease of native coronary artery without angina pectoris: Secondary | ICD-10-CM | POA: Diagnosis not present

## 2016-10-27 DIAGNOSIS — E1122 Type 2 diabetes mellitus with diabetic chronic kidney disease: Secondary | ICD-10-CM | POA: Diagnosis not present

## 2016-10-27 DIAGNOSIS — E1169 Type 2 diabetes mellitus with other specified complication: Secondary | ICD-10-CM | POA: Diagnosis not present

## 2016-10-27 DIAGNOSIS — E785 Hyperlipidemia, unspecified: Secondary | ICD-10-CM | POA: Diagnosis not present

## 2016-10-30 NOTE — Discharge Instructions (Signed)
Cataract Surgery, Care After °Refer to this sheet in the next few weeks. These instructions provide you with information about caring for yourself after your procedure. Your health care provider may also give you more specific instructions. Your treatment has been planned according to current medical practices, but problems sometimes occur. Call your health care provider if you have any problems or questions after your procedure. °What can I expect after the procedure? °After the procedure, it is common to have: °· Itching. °· Discomfort. °· Fluid discharge. °· Sensitivity to light and to touch. °· Bruising. °Follow these instructions at home: °Eye Care  °· Check your eye every day for signs of infection. Watch for: °¨ Redness, swelling, or pain. °¨ Fluid, blood, or pus. °¨ Warmth. °¨ Bad smell. °Activity  °· Avoid strenuous activities, such as playing contact sports, for as long as told by your health care provider. °· Do not drive or operate heavy machinery until your health care provider approves. °· Do not bend or lift heavy objects . Bending increases pressure in the eye. You can walk, climb stairs, and do light household chores. °· Ask your health care provider when you can return to work. If you work in a dusty environment, you may be advised to wear protective eyewear for a period of time. °General instructions  °· Take or apply over-the-counter and prescription medicines only as told by your health care provider. This includes eye drops. °· Do not touch or rub your eyes. °· If you were given a protective shield, wear it as told by your health care provider. If you were not given a protective shield, wear sunglasses as told by your health care provider to protect your eyes. °· Keep the area around your eye clean and dry. Avoid swimming or allowing water to hit you directly in the face while showering until told by your health care provider. Keep soap and shampoo out of your eyes. °· Do not put a contact lens  into the affected eye or eyes until your health care provider approves. °· Keep all follow-up visits as told by your health care provider. This is important. °Contact a health care provider if: ° °· You have increased bruising around your eye. °· You have pain that is not helped with medicine. °· You have a fever. °· You have redness, swelling, or pain in your eye. °· You have fluid, blood, or pus coming from your incision. °· Your vision gets worse. °Get help right away if: °· You have sudden vision loss. °This information is not intended to replace advice given to you by your health care provider. Make sure you discuss any questions you have with your health care provider. °Document Released: 12/19/2004 Document Revised: 10/10/2015 Document Reviewed: 04/11/2015 °Elsevier Interactive Patient Education © 2017 [employer] Inc[employer] ° ° ° ° °General Anesthesia, Adult, Care After °These instructions provide you with information about caring for yourself after your procedure. Your health care provider may also give you more specific instructions. Your treatment has been planned according to current medical practices, but problems sometimes occur. Call your health care provider if you have any problems or questions after your procedure. °What can I expect after the procedure? °After the procedure, it is common to have: °· Vomiting. °· A sore throat. °· Mental slowness. °It is common to feel: °· Nauseous. °· Cold or shivery. °· Sleepy. °· Tired. °· Sore or achy, even in parts of your body where you did not have surgery. °Follow these instructions at   home: °For at least 24 hours after the procedure:  °· Do not: °¨ Participate in activities where you could fall or become injured. °¨ Drive. °¨ Use heavy machinery. °¨ Drink alcohol. °¨ Take sleeping pills or medicines that cause drowsiness. °¨ Make important decisions or sign legal documents. °¨ Take care of children on your own. °· Rest. °Eating and drinking  °· If you vomit, drink  water, juice, or soup when you can drink without vomiting. °· Drink enough fluid to keep your urine clear or pale yellow. °· Make sure you have little or no nausea before eating solid foods. °· Follow the diet recommended by your health care provider. °General instructions  °· Have a responsible adult stay with you until you are awake and alert. °· Return to your normal activities as told by your health care provider. Ask your health care provider what activities are safe for you. °· Take over-the-counter and prescription medicines only as told by your health care provider. °· If you smoke, do not smoke without supervision. °· Keep all follow-up visits as told by your health care provider. This is important. °Contact a health care provider if: °· You continue to have nausea or vomiting at home, and medicines are not helpful. °· You cannot drink fluids or start eating again. °· You cannot urinate after 8-12 hours. °· You develop a skin rash. °· You have fever. °· You have increasing redness at the site of your procedure. °Get help right away if: °· You have difficulty breathing. °· You have chest pain. °· You have unexpected bleeding. °· You feel that you are having a life-threatening or urgent problem. °This information is not intended to replace advice given to you by your health care provider. Make sure you discuss any questions you have with your health care provider. °Document Released: 09/07/2000 Document Revised: 11/04/2015 Document Reviewed: 05/16/2015 °Elsevier Interactive Patient Education © 2017 Elsevier Inc. ° °

## 2016-11-03 DIAGNOSIS — D485 Neoplasm of uncertain behavior of skin: Secondary | ICD-10-CM | POA: Diagnosis not present

## 2016-11-04 ENCOUNTER — Ambulatory Visit
Admission: RE | Admit: 2016-11-04 | Discharge: 2016-11-04 | Disposition: A | Discharge status: Home / Self Care | Payer: PPO | Source: Ambulatory Visit | Attending: Ophthalmology | Admitting: Ophthalmology

## 2016-11-04 ENCOUNTER — Encounter
Admission: RE | Disposition: A | Discharge status: Home / Self Care | Payer: Self-pay | Source: Ambulatory Visit | Attending: Ophthalmology

## 2016-11-04 ENCOUNTER — Ambulatory Visit: Payer: PPO | Admitting: Anesthesiology

## 2016-11-04 DIAGNOSIS — I2510 Atherosclerotic heart disease of native coronary artery without angina pectoris: Secondary | ICD-10-CM | POA: Diagnosis not present

## 2016-11-04 DIAGNOSIS — E7800 Pure hypercholesterolemia, unspecified: Secondary | ICD-10-CM | POA: Insufficient documentation

## 2016-11-04 DIAGNOSIS — I509 Heart failure, unspecified: Secondary | ICD-10-CM | POA: Insufficient documentation

## 2016-11-04 DIAGNOSIS — Z85828 Personal history of other malignant neoplasm of skin: Secondary | ICD-10-CM | POA: Insufficient documentation

## 2016-11-04 DIAGNOSIS — H2511 Age-related nuclear cataract, right eye: Secondary | ICD-10-CM | POA: Insufficient documentation

## 2016-11-04 DIAGNOSIS — I252 Old myocardial infarction: Secondary | ICD-10-CM | POA: Insufficient documentation

## 2016-11-04 DIAGNOSIS — I38 Endocarditis, valve unspecified: Secondary | ICD-10-CM | POA: Insufficient documentation

## 2016-11-04 DIAGNOSIS — I110 Hypertensive heart disease with heart failure: Secondary | ICD-10-CM | POA: Insufficient documentation

## 2016-11-04 DIAGNOSIS — R42 Dizziness and giddiness: Secondary | ICD-10-CM | POA: Insufficient documentation

## 2016-11-04 DIAGNOSIS — H5703 Miosis: Secondary | ICD-10-CM | POA: Insufficient documentation

## 2016-11-04 DIAGNOSIS — M7989 Other specified soft tissue disorders: Secondary | ICD-10-CM | POA: Diagnosis not present

## 2016-11-04 DIAGNOSIS — H9190 Unspecified hearing loss, unspecified ear: Secondary | ICD-10-CM | POA: Diagnosis not present

## 2016-11-04 DIAGNOSIS — E1136 Type 2 diabetes mellitus with diabetic cataract: Secondary | ICD-10-CM | POA: Insufficient documentation

## 2016-11-04 DIAGNOSIS — M1990 Unspecified osteoarthritis, unspecified site: Secondary | ICD-10-CM | POA: Insufficient documentation

## 2016-11-04 HISTORY — PX: CATARACT EXTRACTION W/PHACO: SHX586

## 2016-11-04 HISTORY — DX: Chronic kidney disease, unspecified: N18.9

## 2016-11-04 HISTORY — DX: Heart failure, unspecified: I50.9

## 2016-11-04 HISTORY — DX: Sepsis, unspecified organism: A41.9

## 2016-11-04 HISTORY — DX: Ischemic cardiomyopathy: I25.5

## 2016-11-04 HISTORY — DX: Acute myocardial infarction, unspecified: I21.9

## 2016-11-04 HISTORY — DX: Personal history of other medical treatment: Z92.89

## 2016-11-04 HISTORY — DX: Hyperlipidemia, unspecified: E78.5

## 2016-11-04 HISTORY — DX: Essential (primary) hypertension: I10

## 2016-11-04 HISTORY — DX: Atherosclerotic heart disease of native coronary artery without angina pectoris: I25.10

## 2016-11-04 LAB — GLUCOSE, CAPILLARY
GLUCOSE-CAPILLARY: 148 mg/dL — AB (ref 65–99)
GLUCOSE-CAPILLARY: 64 mg/dL — AB (ref 65–99)
GLUCOSE-CAPILLARY: 84 mg/dL (ref 65–99)
Glucose-Capillary: 58 mg/dL — ABNORMAL LOW (ref 65–99)

## 2016-11-04 SURGERY — PHACOEMULSIFICATION, CATARACT, WITH IOL INSERTION
Anesthesia: Monitor Anesthesia Care | Laterality: Right | Wound class: Clean

## 2016-11-04 MED ORDER — BRIMONIDINE TARTRATE-TIMOLOL 0.2-0.5 % OP SOLN
OPHTHALMIC | Status: DC | PRN
  Administered 2016-11-04: 1 [drp] via OPHTHALMIC

## 2016-11-04 MED ORDER — NA HYALUR & NA CHOND-NA HYALUR 0.4-0.35 ML IO KIT
PACK | INTRAOCULAR | Status: DC | PRN
  Administered 2016-11-04: 1 mL via INTRAOCULAR

## 2016-11-04 MED ORDER — CEFUROXIME OPHTHALMIC INJECTION 1 MG/0.1 ML
INJECTION | OPHTHALMIC | Status: DC | PRN
  Administered 2016-11-04: 0.1 mL via INTRACAMERAL

## 2016-11-04 MED ORDER — DEXTROSE 50 % IV SOLN
25.0000 mL | Freq: Once | INTRAVENOUS | Status: AC
  Administered 2016-11-04: 25 mL via INTRAVENOUS

## 2016-11-04 MED ORDER — LIDOCAINE HCL (PF) 2 % IJ SOLN
INTRAMUSCULAR | Status: DC | PRN
  Administered 2016-11-04: 1 mL

## 2016-11-04 MED ORDER — FENTANYL CITRATE (PF) 100 MCG/2ML IJ SOLN
INTRAMUSCULAR | Status: DC | PRN
  Administered 2016-11-04: 50 ug via INTRAVENOUS

## 2016-11-04 MED ORDER — MIDAZOLAM HCL 2 MG/2ML IJ SOLN
INTRAMUSCULAR | Status: DC | PRN
  Administered 2016-11-04: 2 mg via INTRAVENOUS

## 2016-11-04 MED ORDER — MOXIFLOXACIN HCL 0.5 % OP SOLN
1.0000 [drp] | OPHTHALMIC | Status: DC | PRN
  Administered 2016-11-04 (×3): 1 [drp] via OPHTHALMIC

## 2016-11-04 MED ORDER — ARMC OPHTHALMIC DILATING DROPS
1.0000 "application " | OPHTHALMIC | Status: DC | PRN
  Administered 2016-11-04 (×3): 1 via OPHTHALMIC

## 2016-11-04 MED ORDER — EPINEPHRINE PF 1 MG/ML IJ SOLN
INTRAOCULAR | Status: DC | PRN
  Administered 2016-11-04: 70 mL via OPHTHALMIC

## 2016-11-04 SURGICAL SUPPLY — 27 items
CANNULA ANT/CHMB 27GA (MISCELLANEOUS) ×2 IMPLANT
CARTRIDGE ABBOTT (MISCELLANEOUS) IMPLANT
GLOVE SURG LX 7.5 STRW (GLOVE) ×1
GLOVE SURG LX STRL 7.5 STRW (GLOVE) ×1 IMPLANT
GLOVE SURG TRIUMPH 8.0 PF LTX (GLOVE) ×2 IMPLANT
GOWN STRL REUS W/ TWL LRG LVL3 (GOWN DISPOSABLE) ×2 IMPLANT
GOWN STRL REUS W/TWL LRG LVL3 (GOWN DISPOSABLE) ×2
LENS IOL ACRSF IQ TRC 9 26.0 (Intraocular Lens) ×1 IMPLANT
LENS IOL ACRYSOF IQ TORIC 26.0 (Intraocular Lens) ×1 IMPLANT
LENS IOL IQ TORIC 9 26.0 (Intraocular Lens) ×1 IMPLANT
MARKER SKIN DUAL TIP RULER LAB (MISCELLANEOUS) ×2 IMPLANT
NDL RETROBULBAR .5 NSTRL (NEEDLE) IMPLANT
NEEDLE FILTER BLUNT 18X 1/2SAF (NEEDLE) ×1
NEEDLE FILTER BLUNT 18X1 1/2 (NEEDLE) ×1 IMPLANT
PACK CATARACT BRASINGTON (MISCELLANEOUS) ×2 IMPLANT
PACK EYE AFTER SURG (MISCELLANEOUS) ×2 IMPLANT
PACK OPTHALMIC (MISCELLANEOUS) ×2 IMPLANT
RING MALYGIN 7.0 (MISCELLANEOUS) ×2 IMPLANT
SUT ETHILON 10-0 CS-B-6CS-B-6 (SUTURE)
SUT VICRYL  9 0 (SUTURE)
SUT VICRYL 9 0 (SUTURE) IMPLANT
SUTURE EHLN 10-0 CS-B-6CS-B-6 (SUTURE) IMPLANT
SYR 3ML LL SCALE MARK (SYRINGE) ×2 IMPLANT
SYR 5ML LL (SYRINGE) ×2 IMPLANT
SYR TB 1ML LUER SLIP (SYRINGE) ×2 IMPLANT
WATER STERILE IRR 250ML POUR (IV SOLUTION) ×2 IMPLANT
WIPE NON LINTING 3.25X3.25 (MISCELLANEOUS) ×2 IMPLANT

## 2016-11-04 NOTE — Transfer of Care (Signed)
Immediate Anesthesia Transfer of Care Note  Patient: Erin Mccoy  Procedure(s) Performed: Procedure(s) with comments: CATARACT EXTRACTION PHACO AND INTRAOCULAR LENS PLACEMENT (IOC) Complicated Right diabetic toric lens (Right) - diabetic toric lens  malyugin  Patient Location: PACU  Anesthesia Type: MAC  Level of Consciousness: awake, alert  and patient cooperative  Airway and Oxygen Therapy: Patient Spontanous Breathing and Patient connected to supplemental oxygen  Post-op Assessment: Post-op Vital signs reviewed, Patient's Cardiovascular Status Stable, Respiratory Function Stable, Patent Airway and No signs of Nausea or vomiting  Post-op Vital Signs: Reviewed and stable  Complications: No apparent anesthesia complications

## 2016-11-04 NOTE — H&P (Signed)
The History and Physical notes are on paper, have been signed, and are to be scanned. The patient remains stable and unchanged from the H&P.   Previous H&P reviewed, patient examined, and there are no changes.  Erin Mccoy 11/04/2016 7:48 AM

## 2016-11-04 NOTE — Op Note (Signed)
LOCATION:  [place]   PREOPERATIVE DIAGNOSIS:  Nuclear sclerotic cataract of the right eye with miotic pupil.  H25.11   POSTOPERATIVE DIAGNOSIS:  Nuclear sclerotic cataract of the right eye with miotic pupil.   PROCEDURE:  Phacoemulsification with Toric posterior chamber intraocular lens placement of the right eye which required pupil expansion with the Malyugin ring.   LENS:   Implant Name Type Inv. Item Serial No. Manufacturer Lot No. LRB No. Used  Acrysof IQ Toric lens Intraocular Lens   [id number] ALCON   Right 1     [id number] 26.0 D Toric intraocular lens with 6 diopters of cylindrical power with axis orientation at 7 degrees.   ULTRASOUND TIME: 13 % of 1 minutes, 28 seconds.  CDE 11.3   SURGEON:  Wyonia Hough, MD   ANESTHESIA: Topical with tetracaine drops and 2% Xylocaine jelly, augmented with 1% preservative-free intracameral lidocaine. .   COMPLICATIONS:  None.   DESCRIPTION OF PROCEDURE:  The patient was identified in the holding room and transported to the operating suite and placed in the supine position under the operating microscope.  The right eye was identified as the operative eye, and it was prepped and draped in the usual sterile ophthalmic fashion.    A clear-corneal paracentesis incision was made at the 12:00 position.  0.5 ml of preservative-free 1% lidocaine was injected into the anterior chamber.The anterior chamber was filled with Viscoat.  A 2.4 millimeter near clear corneal incision was then made at the 9:00 position.  a Malyugin ring was placed into the eye to expand it to 31mm for the phacoemulsification. A cystotome and capsulorrhexis forceps were then used to make a curvilinear capsulorrhexis.  Hydrodissection and hydrodelineation were then performed using balanced salt solution.   Phacoemulsification was then used in stop and chop fashion to remove the lens, nucleus and epinucleus.  The remaining cortex was aspirated using the irrigation  and aspiration handpiece.  Provisc viscoelastic was then placed into the capsular bag to distend it for lens placement.  The Verion digital marker was used to align the implant at the intended axis.   A Toric lens was then injected into the capsular bag.  It was rotated clockwise until the axis marks on the lens were approximately 15 degrees in the counterclockwise direction to the intended alignment.  The viscoelastic was aspirated from the eye using the irrigation aspiration handpiece.  Then, a Koch spatula through the sideport incision was used to rotate the lens in a clockwise direction until the axis markings of the intraocular lens were lined up with the Verion alignment.  The Malyugin ting was removed.  Balanced salt solution was then used to hydrate the wounds. Cefuroxime 0.1 ml of a 10mg /ml solution was injected into the anterior chamber for a dose of 1 mg of intracameral antibiotic at the completion of the case.    The eye was noted to have a physiologic pressure and there was no wound leak noted.   Timolol and Brimonidine drops were applied to the eye.  The patient was taken to the recovery room in stable condition having had no complications of anesthesia or surgery.  Jess Toney 11/04/2016, 8:21 AM

## 2016-11-04 NOTE — Anesthesia Preprocedure Evaluation (Signed)
Anesthesia Evaluation  Patient identified by MRN, date of birth, ID band Patient awake    Reviewed: Allergy & Precautions, NPO status , Patient's Chart, lab work & pertinent test results  Airway Mallampati: II  TM Distance: >3 FB Neck ROM: Full    Dental no notable dental hx.    Pulmonary neg pulmonary ROS,    Pulmonary exam normal        Cardiovascular hypertension, Pt. on medications + CAD, + Past MI and +CHF  Normal cardiovascular exam  TTE 8889: MILD LV SYSTOLIC DYSFUNCTION (See above) WITH MILD LVH NORMAL RIGHT VENTRICULAR SYSTOLIC FUNCTION MILD VALVULAR REGURGITATION (See above) NO VALVULAR STENOSIS EF 45-50%   Neuro/Psych negative neurological ROS  negative psych ROS   GI/Hepatic   Endo/Other  diabetes, Well Controlled, Type 2, Oral Hypoglycemic Agents  Renal/GU Renal disease     Musculoskeletal  (+) Arthritis ,   Abdominal   Peds  Hematology negative hematology ROS (+)   Anesthesia Other Findings   Reproductive/Obstetrics                             Anesthesia Physical Anesthesia Plan  ASA: III  Anesthesia Plan: MAC   Post-op Pain Management:    Induction: Intravenous  Airway Management Planned:   Additional Equipment:   Intra-op Plan:   Post-operative Plan:   Informed Consent: I have reviewed the patients History and Physical, chart, labs and discussed the procedure including the risks, benefits and alternatives for the proposed anesthesia with the patient or authorized representative who has indicated his/her understanding and acceptance.     Plan Discussed with: CRNA  Anesthesia Plan Comments:         Anesthesia Quick Evaluation

## 2016-11-04 NOTE — Anesthesia Postprocedure Evaluation (Signed)
Anesthesia Post Note  Patient: Erin Mccoy  Procedure(s) Performed: Procedure(s) (LRB): CATARACT EXTRACTION PHACO AND INTRAOCULAR LENS PLACEMENT (Nixon) Complicated Right diabetic toric lens (Right)  Patient location during evaluation: PACU Anesthesia Type: MAC Level of consciousness: awake and alert and oriented Pain management: pain level controlled Vital Signs Assessment: post-procedure vital signs reviewed and stable Respiratory status: spontaneous breathing and nonlabored ventilation Cardiovascular status: stable Postop Assessment: no signs of nausea or vomiting and adequate PO intake Anesthetic complications: no    Estill Batten

## 2016-11-04 NOTE — Anesthesia Procedure Notes (Signed)
Procedure Name: MAC Performed by: Mayme Genta Pre-anesthesia Checklist: Patient identified, Emergency Drugs available, Suction available, Timeout performed and Patient being monitored Patient Re-evaluated:Patient Re-evaluated prior to inductionOxygen Delivery Method: Nasal cannula Placement Confirmation: positive ETCO2

## 2016-11-26 ENCOUNTER — Ambulatory Visit
Admission: RE | Admit: 2016-11-26 | Discharge: 2016-11-26 | Disposition: A | Discharge status: Home / Self Care | Payer: PPO | Source: Ambulatory Visit | Attending: Internal Medicine | Admitting: Internal Medicine

## 2016-11-26 DIAGNOSIS — Z1231 Encounter for screening mammogram for malignant neoplasm of breast: Secondary | ICD-10-CM | POA: Insufficient documentation

## 2017-01-06 DIAGNOSIS — H20023 Recurrent acute iridocyclitis, bilateral: Secondary | ICD-10-CM | POA: Diagnosis not present

## 2017-01-07 DIAGNOSIS — Z79899 Other long term (current) drug therapy: Secondary | ICD-10-CM | POA: Diagnosis not present

## 2017-01-08 DIAGNOSIS — H209 Unspecified iridocyclitis: Secondary | ICD-10-CM | POA: Diagnosis not present

## 2017-01-15 DIAGNOSIS — H209 Unspecified iridocyclitis: Secondary | ICD-10-CM | POA: Diagnosis not present

## 2017-01-20 DIAGNOSIS — I10 Essential (primary) hypertension: Secondary | ICD-10-CM | POA: Diagnosis not present

## 2017-01-20 DIAGNOSIS — E785 Hyperlipidemia, unspecified: Secondary | ICD-10-CM | POA: Diagnosis not present

## 2017-01-20 DIAGNOSIS — I5022 Chronic systolic (congestive) heart failure: Secondary | ICD-10-CM | POA: Diagnosis not present

## 2017-01-20 DIAGNOSIS — N183 Chronic kidney disease, stage 3 (moderate): Secondary | ICD-10-CM | POA: Diagnosis not present

## 2017-01-20 DIAGNOSIS — E1169 Type 2 diabetes mellitus with other specified complication: Secondary | ICD-10-CM | POA: Diagnosis not present

## 2017-01-20 DIAGNOSIS — E1122 Type 2 diabetes mellitus with diabetic chronic kidney disease: Secondary | ICD-10-CM | POA: Diagnosis not present

## 2017-01-20 DIAGNOSIS — I2510 Atherosclerotic heart disease of native coronary artery without angina pectoris: Secondary | ICD-10-CM | POA: Diagnosis not present

## 2017-01-20 DIAGNOSIS — Z9889 Other specified postprocedural states: Secondary | ICD-10-CM | POA: Diagnosis not present

## 2017-01-22 DIAGNOSIS — H209 Unspecified iridocyclitis: Secondary | ICD-10-CM | POA: Diagnosis not present

## 2017-02-05 DIAGNOSIS — Z79899 Other long term (current) drug therapy: Secondary | ICD-10-CM | POA: Diagnosis not present

## 2017-02-23 DIAGNOSIS — E785 Hyperlipidemia, unspecified: Secondary | ICD-10-CM | POA: Diagnosis not present

## 2017-02-23 DIAGNOSIS — N183 Chronic kidney disease, stage 3 (moderate): Secondary | ICD-10-CM | POA: Diagnosis not present

## 2017-02-23 DIAGNOSIS — I10 Essential (primary) hypertension: Secondary | ICD-10-CM | POA: Diagnosis not present

## 2017-02-23 DIAGNOSIS — E1169 Type 2 diabetes mellitus with other specified complication: Secondary | ICD-10-CM | POA: Diagnosis not present

## 2017-02-23 DIAGNOSIS — E1122 Type 2 diabetes mellitus with diabetic chronic kidney disease: Secondary | ICD-10-CM | POA: Diagnosis not present

## 2017-02-23 DIAGNOSIS — I5022 Chronic systolic (congestive) heart failure: Secondary | ICD-10-CM | POA: Diagnosis not present

## 2017-02-23 DIAGNOSIS — I2510 Atherosclerotic heart disease of native coronary artery without angina pectoris: Secondary | ICD-10-CM | POA: Diagnosis not present

## 2017-03-02 DIAGNOSIS — I10 Essential (primary) hypertension: Secondary | ICD-10-CM | POA: Diagnosis not present

## 2017-03-02 DIAGNOSIS — E1122 Type 2 diabetes mellitus with diabetic chronic kidney disease: Secondary | ICD-10-CM | POA: Diagnosis not present

## 2017-03-02 DIAGNOSIS — E1169 Type 2 diabetes mellitus with other specified complication: Secondary | ICD-10-CM | POA: Diagnosis not present

## 2017-03-02 DIAGNOSIS — E785 Hyperlipidemia, unspecified: Secondary | ICD-10-CM | POA: Diagnosis not present

## 2017-03-02 DIAGNOSIS — Z0000 Encounter for general adult medical examination without abnormal findings: Secondary | ICD-10-CM | POA: Diagnosis not present

## 2017-03-02 DIAGNOSIS — I5022 Chronic systolic (congestive) heart failure: Secondary | ICD-10-CM | POA: Diagnosis not present

## 2017-03-02 DIAGNOSIS — N183 Chronic kidney disease, stage 3 (moderate): Secondary | ICD-10-CM | POA: Diagnosis not present

## 2017-03-08 DIAGNOSIS — L538 Other specified erythematous conditions: Secondary | ICD-10-CM | POA: Diagnosis not present

## 2017-03-08 DIAGNOSIS — L298 Other pruritus: Secondary | ICD-10-CM | POA: Diagnosis not present

## 2017-03-08 DIAGNOSIS — D485 Neoplasm of uncertain behavior of skin: Secondary | ICD-10-CM | POA: Diagnosis not present

## 2017-03-09 DIAGNOSIS — Z961 Presence of intraocular lens: Secondary | ICD-10-CM | POA: Diagnosis not present

## 2017-04-19 DIAGNOSIS — L308 Other specified dermatitis: Secondary | ICD-10-CM | POA: Diagnosis not present

## 2017-04-19 DIAGNOSIS — B372 Candidiasis of skin and nail: Secondary | ICD-10-CM | POA: Diagnosis not present

## 2017-04-19 DIAGNOSIS — B353 Tinea pedis: Secondary | ICD-10-CM | POA: Diagnosis not present

## 2017-04-19 DIAGNOSIS — Z08 Encounter for follow-up examination after completed treatment for malignant neoplasm: Secondary | ICD-10-CM | POA: Diagnosis not present

## 2017-04-19 DIAGNOSIS — Z85828 Personal history of other malignant neoplasm of skin: Secondary | ICD-10-CM | POA: Diagnosis not present

## 2017-04-19 DIAGNOSIS — L738 Other specified follicular disorders: Secondary | ICD-10-CM | POA: Diagnosis not present

## 2017-04-19 DIAGNOSIS — Z85820 Personal history of malignant melanoma of skin: Secondary | ICD-10-CM | POA: Diagnosis not present

## 2017-04-19 DIAGNOSIS — D485 Neoplasm of uncertain behavior of skin: Secondary | ICD-10-CM | POA: Diagnosis not present

## 2017-04-19 DIAGNOSIS — Z872 Personal history of diseases of the skin and subcutaneous tissue: Secondary | ICD-10-CM | POA: Diagnosis not present

## 2017-05-03 DIAGNOSIS — L308 Other specified dermatitis: Secondary | ICD-10-CM | POA: Diagnosis not present

## 2017-05-28 DIAGNOSIS — E785 Hyperlipidemia, unspecified: Secondary | ICD-10-CM | POA: Diagnosis not present

## 2017-05-28 DIAGNOSIS — Z9889 Other specified postprocedural states: Secondary | ICD-10-CM | POA: Diagnosis not present

## 2017-05-28 DIAGNOSIS — N183 Chronic kidney disease, stage 3 (moderate): Secondary | ICD-10-CM | POA: Diagnosis not present

## 2017-05-28 DIAGNOSIS — E1122 Type 2 diabetes mellitus with diabetic chronic kidney disease: Secondary | ICD-10-CM | POA: Diagnosis not present

## 2017-05-28 DIAGNOSIS — I2510 Atherosclerotic heart disease of native coronary artery without angina pectoris: Secondary | ICD-10-CM | POA: Diagnosis not present

## 2017-05-28 DIAGNOSIS — E1169 Type 2 diabetes mellitus with other specified complication: Secondary | ICD-10-CM | POA: Diagnosis not present

## 2017-05-28 DIAGNOSIS — I5022 Chronic systolic (congestive) heart failure: Secondary | ICD-10-CM | POA: Diagnosis not present

## 2017-05-28 DIAGNOSIS — I10 Essential (primary) hypertension: Secondary | ICD-10-CM | POA: Diagnosis not present

## 2017-05-31 DIAGNOSIS — L308 Other specified dermatitis: Secondary | ICD-10-CM | POA: Diagnosis not present

## 2017-05-31 DIAGNOSIS — L570 Actinic keratosis: Secondary | ICD-10-CM | POA: Diagnosis not present

## 2017-06-10 DIAGNOSIS — H401134 Primary open-angle glaucoma, bilateral, indeterminate stage: Secondary | ICD-10-CM | POA: Diagnosis not present

## 2017-07-21 DIAGNOSIS — I5022 Chronic systolic (congestive) heart failure: Secondary | ICD-10-CM | POA: Diagnosis not present

## 2017-07-21 DIAGNOSIS — E1169 Type 2 diabetes mellitus with other specified complication: Secondary | ICD-10-CM | POA: Diagnosis not present

## 2017-07-21 DIAGNOSIS — E1122 Type 2 diabetes mellitus with diabetic chronic kidney disease: Secondary | ICD-10-CM | POA: Diagnosis not present

## 2017-07-21 DIAGNOSIS — N183 Chronic kidney disease, stage 3 (moderate): Secondary | ICD-10-CM | POA: Diagnosis not present

## 2017-07-21 DIAGNOSIS — E785 Hyperlipidemia, unspecified: Secondary | ICD-10-CM | POA: Diagnosis not present

## 2017-07-21 DIAGNOSIS — I10 Essential (primary) hypertension: Secondary | ICD-10-CM | POA: Diagnosis not present

## 2017-07-28 DIAGNOSIS — E538 Deficiency of other specified B group vitamins: Secondary | ICD-10-CM | POA: Diagnosis not present

## 2017-07-28 DIAGNOSIS — E1169 Type 2 diabetes mellitus with other specified complication: Secondary | ICD-10-CM | POA: Diagnosis not present

## 2017-07-28 DIAGNOSIS — E1122 Type 2 diabetes mellitus with diabetic chronic kidney disease: Secondary | ICD-10-CM | POA: Diagnosis not present

## 2017-07-28 DIAGNOSIS — D7589 Other specified diseases of blood and blood-forming organs: Secondary | ICD-10-CM | POA: Diagnosis not present

## 2017-07-28 DIAGNOSIS — I2510 Atherosclerotic heart disease of native coronary artery without angina pectoris: Secondary | ICD-10-CM | POA: Diagnosis not present

## 2017-07-28 DIAGNOSIS — N183 Chronic kidney disease, stage 3 (moderate): Secondary | ICD-10-CM | POA: Diagnosis not present

## 2017-07-28 DIAGNOSIS — E785 Hyperlipidemia, unspecified: Secondary | ICD-10-CM | POA: Diagnosis not present

## 2017-07-28 DIAGNOSIS — I10 Essential (primary) hypertension: Secondary | ICD-10-CM | POA: Diagnosis not present

## 2017-07-28 DIAGNOSIS — Z0000 Encounter for general adult medical examination without abnormal findings: Secondary | ICD-10-CM | POA: Diagnosis not present

## 2017-07-28 DIAGNOSIS — I5022 Chronic systolic (congestive) heart failure: Secondary | ICD-10-CM | POA: Diagnosis not present

## 2017-07-29 DIAGNOSIS — R7989 Other specified abnormal findings of blood chemistry: Secondary | ICD-10-CM | POA: Diagnosis not present

## 2017-07-29 DIAGNOSIS — D510 Vitamin B12 deficiency anemia due to intrinsic factor deficiency: Secondary | ICD-10-CM | POA: Diagnosis not present

## 2017-08-31 DIAGNOSIS — R7989 Other specified abnormal findings of blood chemistry: Secondary | ICD-10-CM | POA: Diagnosis not present

## 2017-08-31 DIAGNOSIS — D510 Vitamin B12 deficiency anemia due to intrinsic factor deficiency: Secondary | ICD-10-CM | POA: Diagnosis not present

## 2017-09-08 DIAGNOSIS — H401134 Primary open-angle glaucoma, bilateral, indeterminate stage: Secondary | ICD-10-CM | POA: Diagnosis not present

## 2017-10-04 DIAGNOSIS — R7989 Other specified abnormal findings of blood chemistry: Secondary | ICD-10-CM | POA: Diagnosis not present

## 2017-10-04 DIAGNOSIS — D510 Vitamin B12 deficiency anemia due to intrinsic factor deficiency: Secondary | ICD-10-CM | POA: Diagnosis not present

## 2017-10-25 DIAGNOSIS — Z85820 Personal history of malignant melanoma of skin: Secondary | ICD-10-CM | POA: Diagnosis not present

## 2017-10-25 DIAGNOSIS — Z85828 Personal history of other malignant neoplasm of skin: Secondary | ICD-10-CM | POA: Diagnosis not present

## 2017-10-25 DIAGNOSIS — L308 Other specified dermatitis: Secondary | ICD-10-CM | POA: Diagnosis not present

## 2017-10-25 DIAGNOSIS — Z872 Personal history of diseases of the skin and subcutaneous tissue: Secondary | ICD-10-CM | POA: Diagnosis not present

## 2017-10-25 DIAGNOSIS — D485 Neoplasm of uncertain behavior of skin: Secondary | ICD-10-CM | POA: Diagnosis not present

## 2017-10-25 DIAGNOSIS — D2362 Other benign neoplasm of skin of left upper limb, including shoulder: Secondary | ICD-10-CM | POA: Diagnosis not present

## 2017-10-25 DIAGNOSIS — Z08 Encounter for follow-up examination after completed treatment for malignant neoplasm: Secondary | ICD-10-CM | POA: Diagnosis not present

## 2017-11-10 DIAGNOSIS — M79674 Pain in right toe(s): Secondary | ICD-10-CM | POA: Diagnosis not present

## 2017-11-10 DIAGNOSIS — B353 Tinea pedis: Secondary | ICD-10-CM | POA: Diagnosis not present

## 2017-11-10 DIAGNOSIS — B351 Tinea unguium: Secondary | ICD-10-CM | POA: Diagnosis not present

## 2017-11-10 DIAGNOSIS — M79675 Pain in left toe(s): Secondary | ICD-10-CM | POA: Diagnosis not present

## 2017-11-29 ENCOUNTER — Other Ambulatory Visit: Payer: Self-pay | Admitting: Internal Medicine

## 2017-11-29 DIAGNOSIS — Z1231 Encounter for screening mammogram for malignant neoplasm of breast: Secondary | ICD-10-CM

## 2017-12-01 DIAGNOSIS — E1169 Type 2 diabetes mellitus with other specified complication: Secondary | ICD-10-CM | POA: Diagnosis not present

## 2017-12-01 DIAGNOSIS — I429 Cardiomyopathy, unspecified: Secondary | ICD-10-CM | POA: Diagnosis not present

## 2017-12-01 DIAGNOSIS — I10 Essential (primary) hypertension: Secondary | ICD-10-CM | POA: Diagnosis not present

## 2017-12-01 DIAGNOSIS — E039 Hypothyroidism, unspecified: Secondary | ICD-10-CM | POA: Diagnosis not present

## 2017-12-01 DIAGNOSIS — Z9889 Other specified postprocedural states: Secondary | ICD-10-CM | POA: Diagnosis not present

## 2017-12-01 DIAGNOSIS — E538 Deficiency of other specified B group vitamins: Secondary | ICD-10-CM | POA: Diagnosis not present

## 2017-12-01 DIAGNOSIS — D510 Vitamin B12 deficiency anemia due to intrinsic factor deficiency: Secondary | ICD-10-CM | POA: Diagnosis not present

## 2017-12-01 DIAGNOSIS — E785 Hyperlipidemia, unspecified: Secondary | ICD-10-CM | POA: Diagnosis not present

## 2017-12-01 DIAGNOSIS — I2510 Atherosclerotic heart disease of native coronary artery without angina pectoris: Secondary | ICD-10-CM | POA: Diagnosis not present

## 2017-12-01 DIAGNOSIS — N183 Chronic kidney disease, stage 3 (moderate): Secondary | ICD-10-CM | POA: Diagnosis not present

## 2017-12-01 DIAGNOSIS — I5022 Chronic systolic (congestive) heart failure: Secondary | ICD-10-CM | POA: Diagnosis not present

## 2017-12-01 DIAGNOSIS — E1122 Type 2 diabetes mellitus with diabetic chronic kidney disease: Secondary | ICD-10-CM | POA: Diagnosis not present

## 2017-12-06 DIAGNOSIS — H6691 Otitis media, unspecified, right ear: Secondary | ICD-10-CM | POA: Diagnosis not present

## 2017-12-06 DIAGNOSIS — J40 Bronchitis, not specified as acute or chronic: Secondary | ICD-10-CM | POA: Diagnosis not present

## 2017-12-07 ENCOUNTER — Ambulatory Visit
Admission: RE | Admit: 2017-12-07 | Discharge: 2017-12-07 | Disposition: A | Discharge status: Home / Self Care | Payer: PPO | Source: Ambulatory Visit | Attending: Internal Medicine | Admitting: Internal Medicine

## 2017-12-07 DIAGNOSIS — Z1231 Encounter for screening mammogram for malignant neoplasm of breast: Secondary | ICD-10-CM | POA: Insufficient documentation

## 2018-01-06 DIAGNOSIS — H401134 Primary open-angle glaucoma, bilateral, indeterminate stage: Secondary | ICD-10-CM | POA: Diagnosis not present

## 2018-01-20 DIAGNOSIS — E1122 Type 2 diabetes mellitus with diabetic chronic kidney disease: Secondary | ICD-10-CM | POA: Diagnosis not present

## 2018-01-20 DIAGNOSIS — I10 Essential (primary) hypertension: Secondary | ICD-10-CM | POA: Diagnosis not present

## 2018-01-20 DIAGNOSIS — N183 Chronic kidney disease, stage 3 (moderate): Secondary | ICD-10-CM | POA: Diagnosis not present

## 2018-01-20 DIAGNOSIS — I5022 Chronic systolic (congestive) heart failure: Secondary | ICD-10-CM | POA: Diagnosis not present

## 2018-01-27 DIAGNOSIS — D510 Vitamin B12 deficiency anemia due to intrinsic factor deficiency: Secondary | ICD-10-CM | POA: Diagnosis not present

## 2018-01-27 DIAGNOSIS — E785 Hyperlipidemia, unspecified: Secondary | ICD-10-CM | POA: Diagnosis not present

## 2018-01-27 DIAGNOSIS — I2510 Atherosclerotic heart disease of native coronary artery without angina pectoris: Secondary | ICD-10-CM | POA: Diagnosis not present

## 2018-01-27 DIAGNOSIS — N183 Chronic kidney disease, stage 3 (moderate): Secondary | ICD-10-CM | POA: Diagnosis not present

## 2018-01-27 DIAGNOSIS — I429 Cardiomyopathy, unspecified: Secondary | ICD-10-CM | POA: Diagnosis not present

## 2018-01-27 DIAGNOSIS — I10 Essential (primary) hypertension: Secondary | ICD-10-CM | POA: Diagnosis not present

## 2018-01-27 DIAGNOSIS — E1122 Type 2 diabetes mellitus with diabetic chronic kidney disease: Secondary | ICD-10-CM | POA: Diagnosis not present

## 2018-01-27 DIAGNOSIS — E1169 Type 2 diabetes mellitus with other specified complication: Secondary | ICD-10-CM | POA: Diagnosis not present

## 2018-05-09 DIAGNOSIS — H18003 Unspecified corneal deposit, bilateral: Secondary | ICD-10-CM | POA: Diagnosis not present

## 2018-05-23 DIAGNOSIS — H209 Unspecified iridocyclitis: Secondary | ICD-10-CM | POA: Diagnosis not present

## 2018-06-03 DIAGNOSIS — H6691 Otitis media, unspecified, right ear: Secondary | ICD-10-CM | POA: Diagnosis not present

## 2018-06-03 DIAGNOSIS — J069 Acute upper respiratory infection, unspecified: Secondary | ICD-10-CM | POA: Diagnosis not present

## 2018-06-06 DIAGNOSIS — I429 Cardiomyopathy, unspecified: Secondary | ICD-10-CM | POA: Diagnosis not present

## 2018-06-06 DIAGNOSIS — E1122 Type 2 diabetes mellitus with diabetic chronic kidney disease: Secondary | ICD-10-CM | POA: Diagnosis not present

## 2018-06-06 DIAGNOSIS — I5022 Chronic systolic (congestive) heart failure: Secondary | ICD-10-CM | POA: Diagnosis not present

## 2018-06-06 DIAGNOSIS — E1169 Type 2 diabetes mellitus with other specified complication: Secondary | ICD-10-CM | POA: Diagnosis not present

## 2018-06-06 DIAGNOSIS — I2510 Atherosclerotic heart disease of native coronary artery without angina pectoris: Secondary | ICD-10-CM | POA: Diagnosis not present

## 2018-06-06 DIAGNOSIS — Z9889 Other specified postprocedural states: Secondary | ICD-10-CM | POA: Diagnosis not present

## 2018-06-06 DIAGNOSIS — I10 Essential (primary) hypertension: Secondary | ICD-10-CM | POA: Diagnosis not present

## 2018-06-06 DIAGNOSIS — N183 Chronic kidney disease, stage 3 (moderate): Secondary | ICD-10-CM | POA: Diagnosis not present

## 2018-06-06 DIAGNOSIS — E785 Hyperlipidemia, unspecified: Secondary | ICD-10-CM | POA: Diagnosis not present

## 2018-06-10 DIAGNOSIS — E1122 Type 2 diabetes mellitus with diabetic chronic kidney disease: Secondary | ICD-10-CM | POA: Diagnosis not present

## 2018-06-10 DIAGNOSIS — N183 Chronic kidney disease, stage 3 (moderate): Secondary | ICD-10-CM | POA: Diagnosis not present

## 2018-06-10 DIAGNOSIS — J019 Acute sinusitis, unspecified: Secondary | ICD-10-CM | POA: Diagnosis not present

## 2018-06-13 DIAGNOSIS — H209 Unspecified iridocyclitis: Secondary | ICD-10-CM | POA: Diagnosis not present

## 2018-07-25 DIAGNOSIS — E1122 Type 2 diabetes mellitus with diabetic chronic kidney disease: Secondary | ICD-10-CM | POA: Diagnosis not present

## 2018-07-25 DIAGNOSIS — D510 Vitamin B12 deficiency anemia due to intrinsic factor deficiency: Secondary | ICD-10-CM | POA: Diagnosis not present

## 2018-07-25 DIAGNOSIS — I429 Cardiomyopathy, unspecified: Secondary | ICD-10-CM | POA: Diagnosis not present

## 2018-07-25 DIAGNOSIS — N183 Chronic kidney disease, stage 3 (moderate): Secondary | ICD-10-CM | POA: Diagnosis not present

## 2018-07-25 DIAGNOSIS — I10 Essential (primary) hypertension: Secondary | ICD-10-CM | POA: Diagnosis not present

## 2018-08-01 DIAGNOSIS — E785 Hyperlipidemia, unspecified: Secondary | ICD-10-CM | POA: Diagnosis not present

## 2018-08-01 DIAGNOSIS — I429 Cardiomyopathy, unspecified: Secondary | ICD-10-CM | POA: Diagnosis not present

## 2018-08-01 DIAGNOSIS — Z0000 Encounter for general adult medical examination without abnormal findings: Secondary | ICD-10-CM | POA: Diagnosis not present

## 2018-08-01 DIAGNOSIS — E1122 Type 2 diabetes mellitus with diabetic chronic kidney disease: Secondary | ICD-10-CM | POA: Diagnosis not present

## 2018-08-01 DIAGNOSIS — I10 Essential (primary) hypertension: Secondary | ICD-10-CM | POA: Diagnosis not present

## 2018-08-01 DIAGNOSIS — I5022 Chronic systolic (congestive) heart failure: Secondary | ICD-10-CM | POA: Diagnosis not present

## 2018-08-01 DIAGNOSIS — E039 Hypothyroidism, unspecified: Secondary | ICD-10-CM | POA: Diagnosis not present

## 2018-08-01 DIAGNOSIS — I2510 Atherosclerotic heart disease of native coronary artery without angina pectoris: Secondary | ICD-10-CM | POA: Diagnosis not present

## 2018-08-01 DIAGNOSIS — N183 Chronic kidney disease, stage 3 (moderate): Secondary | ICD-10-CM | POA: Diagnosis not present

## 2018-08-01 DIAGNOSIS — E1169 Type 2 diabetes mellitus with other specified complication: Secondary | ICD-10-CM | POA: Diagnosis not present

## 2018-08-08 DIAGNOSIS — L308 Other specified dermatitis: Secondary | ICD-10-CM | POA: Diagnosis not present

## 2018-08-16 DIAGNOSIS — J069 Acute upper respiratory infection, unspecified: Secondary | ICD-10-CM | POA: Diagnosis not present

## 2018-08-16 DIAGNOSIS — R05 Cough: Secondary | ICD-10-CM | POA: Diagnosis not present

## 2018-08-16 DIAGNOSIS — R0982 Postnasal drip: Secondary | ICD-10-CM | POA: Diagnosis not present

## 2018-08-18 DIAGNOSIS — J019 Acute sinusitis, unspecified: Secondary | ICD-10-CM | POA: Diagnosis not present

## 2018-08-31 DIAGNOSIS — H209 Unspecified iridocyclitis: Secondary | ICD-10-CM | POA: Diagnosis not present

## 2018-09-20 DIAGNOSIS — I4819 Other persistent atrial fibrillation: Secondary | ICD-10-CM | POA: Diagnosis not present

## 2018-09-20 DIAGNOSIS — H209 Unspecified iridocyclitis: Secondary | ICD-10-CM | POA: Diagnosis not present

## 2018-09-20 DIAGNOSIS — Z5181 Encounter for therapeutic drug level monitoring: Secondary | ICD-10-CM | POA: Diagnosis not present

## 2018-09-20 DIAGNOSIS — I6300 Cerebral infarction due to thrombosis of unspecified precerebral artery: Secondary | ICD-10-CM | POA: Diagnosis not present

## 2018-09-20 DIAGNOSIS — Z7901 Long term (current) use of anticoagulants: Secondary | ICD-10-CM | POA: Diagnosis not present

## 2018-10-18 DIAGNOSIS — H209 Unspecified iridocyclitis: Secondary | ICD-10-CM | POA: Diagnosis not present

## 2018-11-17 DIAGNOSIS — E1122 Type 2 diabetes mellitus with diabetic chronic kidney disease: Secondary | ICD-10-CM | POA: Diagnosis not present

## 2018-11-17 DIAGNOSIS — E785 Hyperlipidemia, unspecified: Secondary | ICD-10-CM | POA: Diagnosis not present

## 2018-11-17 DIAGNOSIS — H401134 Primary open-angle glaucoma, bilateral, indeterminate stage: Secondary | ICD-10-CM | POA: Diagnosis not present

## 2018-11-17 DIAGNOSIS — E1169 Type 2 diabetes mellitus with other specified complication: Secondary | ICD-10-CM | POA: Diagnosis not present

## 2018-11-17 DIAGNOSIS — I10 Essential (primary) hypertension: Secondary | ICD-10-CM | POA: Diagnosis not present

## 2018-11-17 DIAGNOSIS — N183 Chronic kidney disease, stage 3 (moderate): Secondary | ICD-10-CM | POA: Diagnosis not present

## 2018-11-22 DIAGNOSIS — E1169 Type 2 diabetes mellitus with other specified complication: Secondary | ICD-10-CM | POA: Diagnosis not present

## 2018-11-22 DIAGNOSIS — Z9889 Other specified postprocedural states: Secondary | ICD-10-CM | POA: Diagnosis not present

## 2018-11-22 DIAGNOSIS — I2510 Atherosclerotic heart disease of native coronary artery without angina pectoris: Secondary | ICD-10-CM | POA: Diagnosis not present

## 2018-11-22 DIAGNOSIS — I5022 Chronic systolic (congestive) heart failure: Secondary | ICD-10-CM | POA: Diagnosis not present

## 2018-11-22 DIAGNOSIS — I10 Essential (primary) hypertension: Secondary | ICD-10-CM | POA: Diagnosis not present

## 2018-11-22 DIAGNOSIS — E785 Hyperlipidemia, unspecified: Secondary | ICD-10-CM | POA: Diagnosis not present

## 2018-11-24 DIAGNOSIS — E1122 Type 2 diabetes mellitus with diabetic chronic kidney disease: Secondary | ICD-10-CM | POA: Diagnosis not present

## 2018-11-24 DIAGNOSIS — I2510 Atherosclerotic heart disease of native coronary artery without angina pectoris: Secondary | ICD-10-CM | POA: Diagnosis not present

## 2018-11-24 DIAGNOSIS — I5022 Chronic systolic (congestive) heart failure: Secondary | ICD-10-CM | POA: Diagnosis not present

## 2018-11-24 DIAGNOSIS — E1169 Type 2 diabetes mellitus with other specified complication: Secondary | ICD-10-CM | POA: Diagnosis not present

## 2018-11-24 DIAGNOSIS — D510 Vitamin B12 deficiency anemia due to intrinsic factor deficiency: Secondary | ICD-10-CM | POA: Diagnosis not present

## 2018-11-24 DIAGNOSIS — E785 Hyperlipidemia, unspecified: Secondary | ICD-10-CM | POA: Diagnosis not present

## 2018-11-24 DIAGNOSIS — N183 Chronic kidney disease, stage 3 (moderate): Secondary | ICD-10-CM | POA: Diagnosis not present

## 2018-11-24 DIAGNOSIS — I429 Cardiomyopathy, unspecified: Secondary | ICD-10-CM | POA: Diagnosis not present

## 2018-12-01 DIAGNOSIS — H401134 Primary open-angle glaucoma, bilateral, indeterminate stage: Secondary | ICD-10-CM | POA: Diagnosis not present

## 2018-12-05 DIAGNOSIS — L720 Epidermal cyst: Secondary | ICD-10-CM | POA: Diagnosis not present

## 2018-12-05 DIAGNOSIS — D485 Neoplasm of uncertain behavior of skin: Secondary | ICD-10-CM | POA: Diagnosis not present

## 2018-12-12 ENCOUNTER — Other Ambulatory Visit: Payer: Self-pay | Admitting: Internal Medicine

## 2018-12-12 DIAGNOSIS — Z1231 Encounter for screening mammogram for malignant neoplasm of breast: Secondary | ICD-10-CM

## 2019-01-10 ENCOUNTER — Ambulatory Visit
Admission: RE | Admit: 2019-01-10 | Discharge: 2019-01-10 | Disposition: A | Discharge status: Home / Self Care | Payer: PPO | Source: Ambulatory Visit | Attending: Internal Medicine | Admitting: Internal Medicine

## 2019-01-10 ENCOUNTER — Other Ambulatory Visit: Payer: Self-pay

## 2019-01-10 DIAGNOSIS — Z1231 Encounter for screening mammogram for malignant neoplasm of breast: Secondary | ICD-10-CM | POA: Insufficient documentation

## 2019-05-26 DIAGNOSIS — E1122 Type 2 diabetes mellitus with diabetic chronic kidney disease: Secondary | ICD-10-CM | POA: Diagnosis not present

## 2019-05-26 DIAGNOSIS — N183 Chronic kidney disease, stage 3 unspecified: Secondary | ICD-10-CM | POA: Diagnosis not present

## 2019-05-26 DIAGNOSIS — I5022 Chronic systolic (congestive) heart failure: Secondary | ICD-10-CM | POA: Diagnosis not present

## 2019-05-26 DIAGNOSIS — I429 Cardiomyopathy, unspecified: Secondary | ICD-10-CM | POA: Diagnosis not present

## 2019-05-29 DIAGNOSIS — I5022 Chronic systolic (congestive) heart failure: Secondary | ICD-10-CM | POA: Diagnosis not present

## 2019-05-29 DIAGNOSIS — E785 Hyperlipidemia, unspecified: Secondary | ICD-10-CM | POA: Diagnosis not present

## 2019-05-29 DIAGNOSIS — E1169 Type 2 diabetes mellitus with other specified complication: Secondary | ICD-10-CM | POA: Diagnosis not present

## 2019-05-29 DIAGNOSIS — I429 Cardiomyopathy, unspecified: Secondary | ICD-10-CM | POA: Diagnosis not present

## 2019-05-29 DIAGNOSIS — E1122 Type 2 diabetes mellitus with diabetic chronic kidney disease: Secondary | ICD-10-CM | POA: Diagnosis not present

## 2019-05-29 DIAGNOSIS — I10 Essential (primary) hypertension: Secondary | ICD-10-CM | POA: Diagnosis not present

## 2019-05-29 DIAGNOSIS — I2510 Atherosclerotic heart disease of native coronary artery without angina pectoris: Secondary | ICD-10-CM | POA: Diagnosis not present

## 2019-05-29 DIAGNOSIS — Z9889 Other specified postprocedural states: Secondary | ICD-10-CM | POA: Diagnosis not present

## 2019-05-29 DIAGNOSIS — N183 Chronic kidney disease, stage 3 unspecified: Secondary | ICD-10-CM | POA: Diagnosis not present

## 2019-06-02 DIAGNOSIS — E1169 Type 2 diabetes mellitus with other specified complication: Secondary | ICD-10-CM | POA: Diagnosis not present

## 2019-06-02 DIAGNOSIS — E1122 Type 2 diabetes mellitus with diabetic chronic kidney disease: Secondary | ICD-10-CM | POA: Diagnosis not present

## 2019-06-02 DIAGNOSIS — D510 Vitamin B12 deficiency anemia due to intrinsic factor deficiency: Secondary | ICD-10-CM | POA: Diagnosis not present

## 2019-06-02 DIAGNOSIS — E785 Hyperlipidemia, unspecified: Secondary | ICD-10-CM | POA: Diagnosis not present

## 2019-06-02 DIAGNOSIS — I10 Essential (primary) hypertension: Secondary | ICD-10-CM | POA: Diagnosis not present

## 2019-06-02 DIAGNOSIS — I429 Cardiomyopathy, unspecified: Secondary | ICD-10-CM | POA: Diagnosis not present

## 2019-06-02 DIAGNOSIS — I5022 Chronic systolic (congestive) heart failure: Secondary | ICD-10-CM | POA: Diagnosis not present

## 2019-06-02 DIAGNOSIS — N183 Chronic kidney disease, stage 3 unspecified: Secondary | ICD-10-CM | POA: Diagnosis not present

## 2019-07-12 DIAGNOSIS — H401134 Primary open-angle glaucoma, bilateral, indeterminate stage: Secondary | ICD-10-CM | POA: Diagnosis not present

## 2019-07-24 ENCOUNTER — Other Ambulatory Visit: Payer: Self-pay

## 2019-07-24 ENCOUNTER — Ambulatory Visit: Payer: PPO | Attending: Internal Medicine

## 2019-07-24 DIAGNOSIS — Z23 Encounter for immunization: Secondary | ICD-10-CM

## 2019-07-24 NOTE — Progress Notes (Signed)
   Covid-19 Vaccination Clinic  Name:  Erin Mccoy    MRN: [id number] DOB: May 13, 1951  07/24/2019  Erin Mccoy was observed post Covid-19 immunization for 15 minutes without incidence. She was provided with Vaccine Information Sheet and instruction to access the V-Safe system.   Erin Mccoy was instructed to call 911 with any severe reactions post vaccine: Marland Kitchen Difficulty breathing  . Swelling of your face and throat  . A fast heartbeat  . A bad rash all over your body  . Dizziness and weakness    Immunizations Administered    Name Date Dose VIS Date Route   Moderna COVID-19 Vaccine 07/24/2019  1:40 PM 0.5 mL 05/16/2019 Intramuscular   Manufacturer: Moderna   Lot: [id number]   [id number]

## 2019-08-23 ENCOUNTER — Ambulatory Visit: Payer: PPO | Attending: Internal Medicine

## 2019-08-23 DIAGNOSIS — Z23 Encounter for immunization: Secondary | ICD-10-CM

## 2019-08-23 NOTE — Progress Notes (Signed)
   Covid-19 Vaccination Clinic  Name:  Erin Mccoy    MRN: [id number] DOB: 1950/12/26  08/23/2019  Ms. Erin Mccoy was observed post Covid-19 immunization for 15 minutes without incident. She was provided with Vaccine Information Sheet and instruction to access the V-Safe system.   Ms. Erin Mccoy was instructed to call 911 with any severe reactions post vaccine: Marland Kitchen Difficulty breathing  . Swelling of face and throat  . A fast heartbeat  . A bad rash all over body  . Dizziness and weakness   Immunizations Administered    Name Date Dose VIS Date Route   Moderna COVID-19 Vaccine 08/23/2019 12:41 PM 0.5 mL 05/16/2019 Intramuscular   Manufacturer: Moderna   Lot: [id number]   [id number]

## 2019-12-04 DIAGNOSIS — I5022 Chronic systolic (congestive) heart failure: Secondary | ICD-10-CM | POA: Diagnosis not present

## 2019-12-04 DIAGNOSIS — I10 Essential (primary) hypertension: Secondary | ICD-10-CM | POA: Diagnosis not present

## 2019-12-04 DIAGNOSIS — E1122 Type 2 diabetes mellitus with diabetic chronic kidney disease: Secondary | ICD-10-CM | POA: Diagnosis not present

## 2019-12-04 DIAGNOSIS — D510 Vitamin B12 deficiency anemia due to intrinsic factor deficiency: Secondary | ICD-10-CM | POA: Diagnosis not present

## 2019-12-04 DIAGNOSIS — N183 Chronic kidney disease, stage 3 unspecified: Secondary | ICD-10-CM | POA: Diagnosis not present

## 2019-12-05 DIAGNOSIS — E1122 Type 2 diabetes mellitus with diabetic chronic kidney disease: Secondary | ICD-10-CM | POA: Diagnosis not present

## 2019-12-05 DIAGNOSIS — E785 Hyperlipidemia, unspecified: Secondary | ICD-10-CM | POA: Diagnosis not present

## 2019-12-05 DIAGNOSIS — I5022 Chronic systolic (congestive) heart failure: Secondary | ICD-10-CM | POA: Diagnosis not present

## 2019-12-05 DIAGNOSIS — N183 Chronic kidney disease, stage 3 unspecified: Secondary | ICD-10-CM | POA: Diagnosis not present

## 2019-12-05 DIAGNOSIS — I429 Cardiomyopathy, unspecified: Secondary | ICD-10-CM | POA: Diagnosis not present

## 2019-12-05 DIAGNOSIS — E1169 Type 2 diabetes mellitus with other specified complication: Secondary | ICD-10-CM | POA: Diagnosis not present

## 2019-12-05 DIAGNOSIS — I10 Essential (primary) hypertension: Secondary | ICD-10-CM | POA: Diagnosis not present

## 2019-12-05 DIAGNOSIS — I2510 Atherosclerotic heart disease of native coronary artery without angina pectoris: Secondary | ICD-10-CM | POA: Diagnosis not present

## 2019-12-11 ENCOUNTER — Other Ambulatory Visit: Payer: Self-pay | Admitting: Internal Medicine

## 2019-12-11 DIAGNOSIS — Z1231 Encounter for screening mammogram for malignant neoplasm of breast: Secondary | ICD-10-CM

## 2019-12-11 DIAGNOSIS — E1122 Type 2 diabetes mellitus with diabetic chronic kidney disease: Secondary | ICD-10-CM | POA: Diagnosis not present

## 2019-12-11 DIAGNOSIS — E785 Hyperlipidemia, unspecified: Secondary | ICD-10-CM | POA: Diagnosis not present

## 2019-12-11 DIAGNOSIS — D510 Vitamin B12 deficiency anemia due to intrinsic factor deficiency: Secondary | ICD-10-CM | POA: Diagnosis not present

## 2019-12-11 DIAGNOSIS — I10 Essential (primary) hypertension: Secondary | ICD-10-CM | POA: Diagnosis not present

## 2019-12-11 DIAGNOSIS — N183 Chronic kidney disease, stage 3 unspecified: Secondary | ICD-10-CM | POA: Diagnosis not present

## 2019-12-11 DIAGNOSIS — I2510 Atherosclerotic heart disease of native coronary artery without angina pectoris: Secondary | ICD-10-CM | POA: Diagnosis not present

## 2019-12-11 DIAGNOSIS — I429 Cardiomyopathy, unspecified: Secondary | ICD-10-CM | POA: Diagnosis not present

## 2019-12-11 DIAGNOSIS — Z0000 Encounter for general adult medical examination without abnormal findings: Secondary | ICD-10-CM | POA: Diagnosis not present

## 2019-12-11 DIAGNOSIS — E1169 Type 2 diabetes mellitus with other specified complication: Secondary | ICD-10-CM | POA: Diagnosis not present

## 2019-12-11 DIAGNOSIS — I5022 Chronic systolic (congestive) heart failure: Secondary | ICD-10-CM | POA: Diagnosis not present

## 2020-01-11 ENCOUNTER — Ambulatory Visit
Admission: RE | Admit: 2020-01-11 | Discharge: 2020-01-11 | Disposition: A | Discharge status: Home / Self Care | Payer: PPO | Source: Ambulatory Visit | Attending: Internal Medicine | Admitting: Internal Medicine

## 2020-01-11 ENCOUNTER — Other Ambulatory Visit: Payer: Self-pay

## 2020-01-11 DIAGNOSIS — Z1231 Encounter for screening mammogram for malignant neoplasm of breast: Secondary | ICD-10-CM | POA: Diagnosis not present

## 2020-01-23 DIAGNOSIS — E113593 Type 2 diabetes mellitus with proliferative diabetic retinopathy without macular edema, bilateral: Secondary | ICD-10-CM | POA: Diagnosis not present

## 2020-01-23 DIAGNOSIS — H401134 Primary open-angle glaucoma, bilateral, indeterminate stage: Secondary | ICD-10-CM | POA: Diagnosis not present

## 2020-03-03 IMAGING — MG DIGITAL SCREENING BILATERAL MAMMOGRAM WITH TOMO AND CAD
6 of 10 series · 6 of 30 positions shown · non-contrast
Comparison: Previous exam(s).

CLINICAL DATA: Screening.

EXAM:
DIGITAL SCREENING BILATERAL MAMMOGRAM WITH TOMO AND CAD

[R MLO synth-2D (1 of 2)]
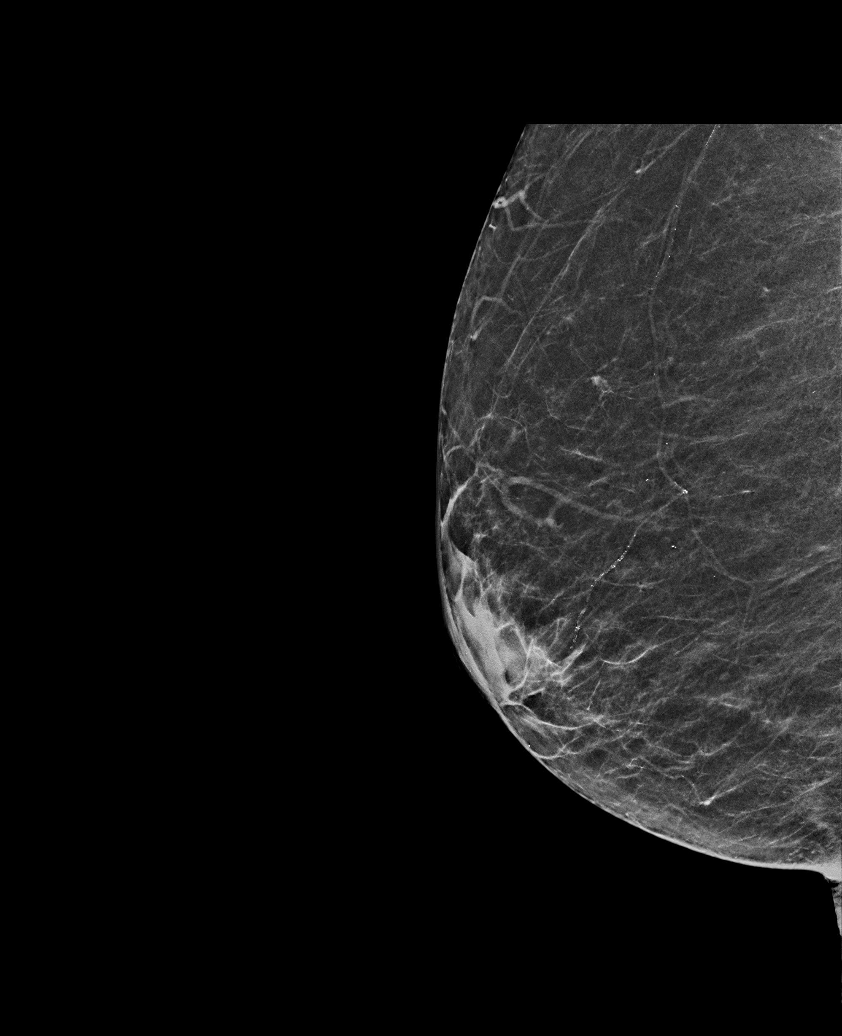

[L CC synth-2D]
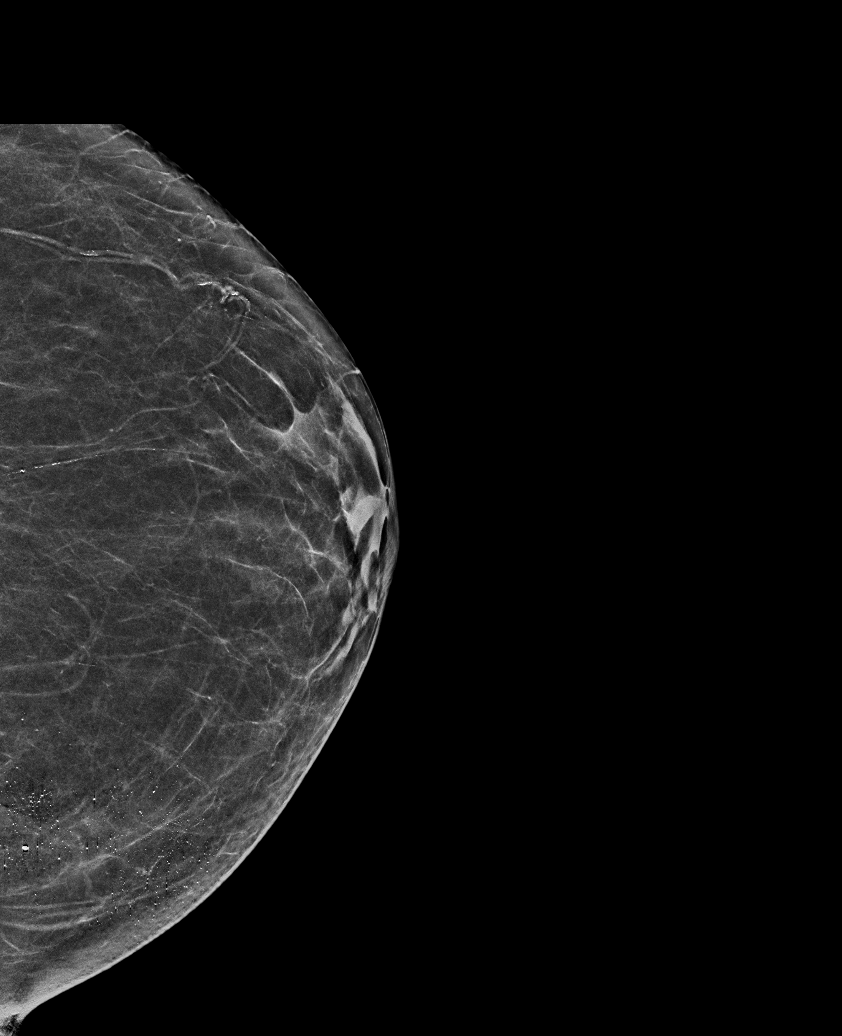

[R CC synth-2D]
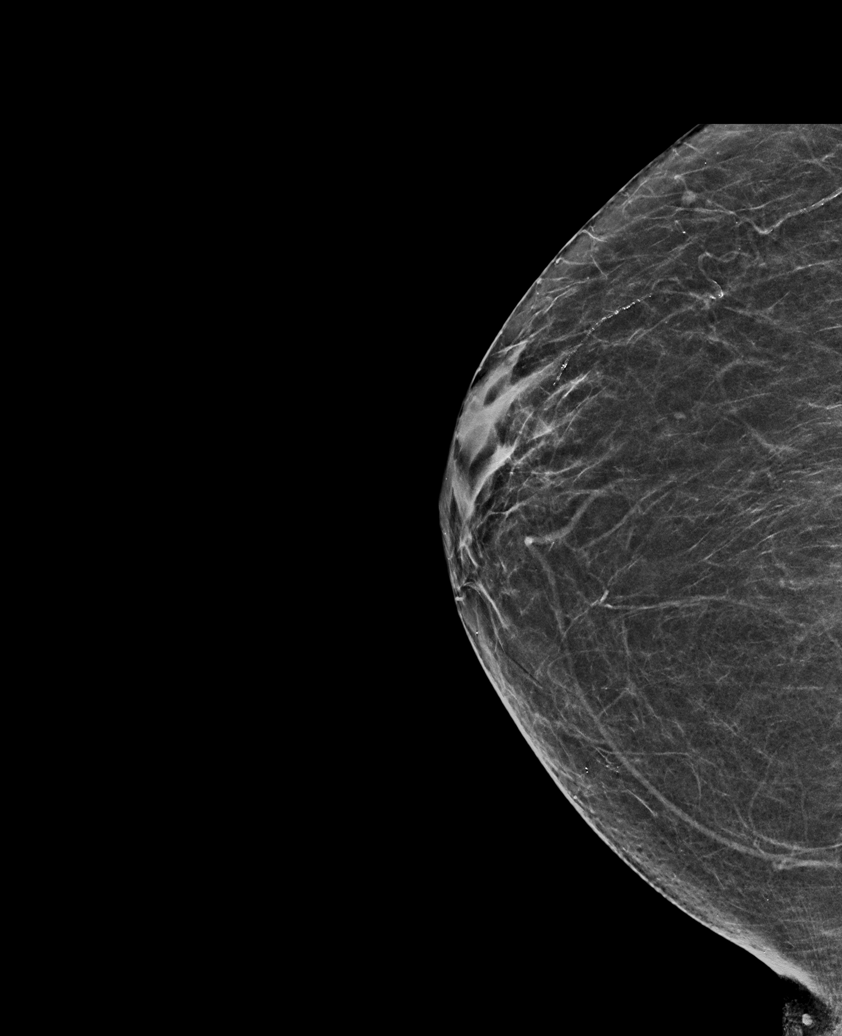

[L MLO synth-2D]
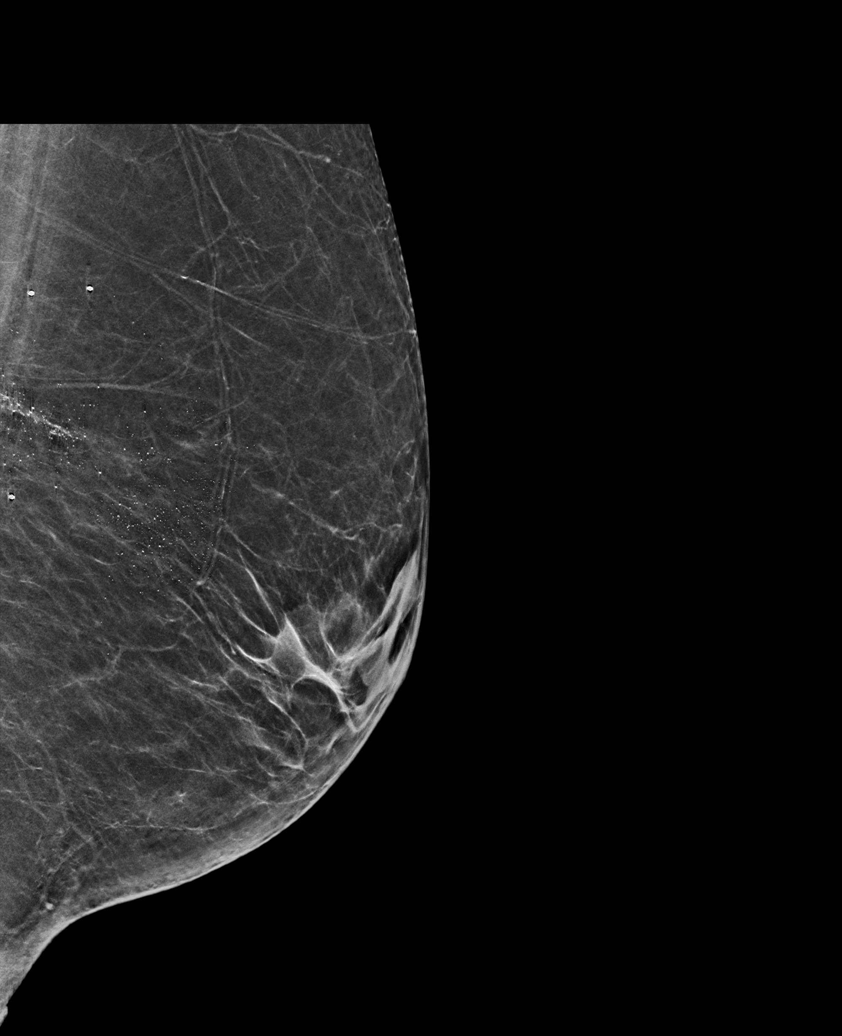

[R MLO synth-2D (2 of 2)]
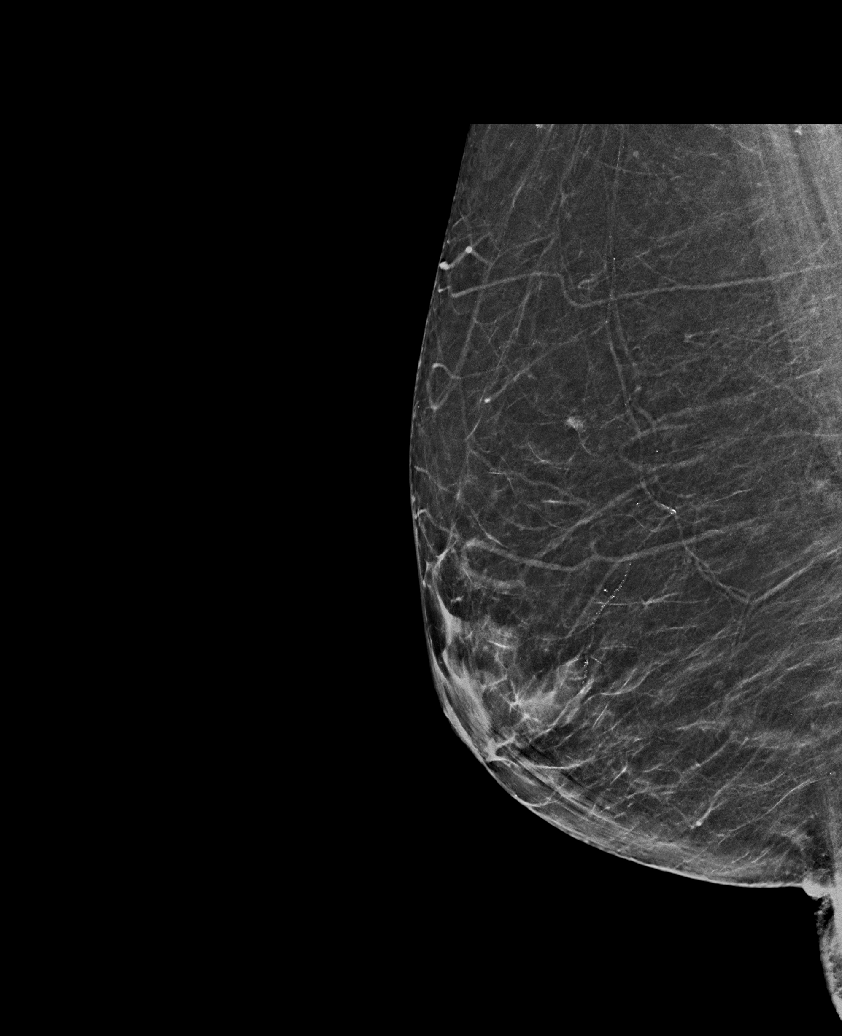

[L MLO tomo · tomo slice 33/65.0]
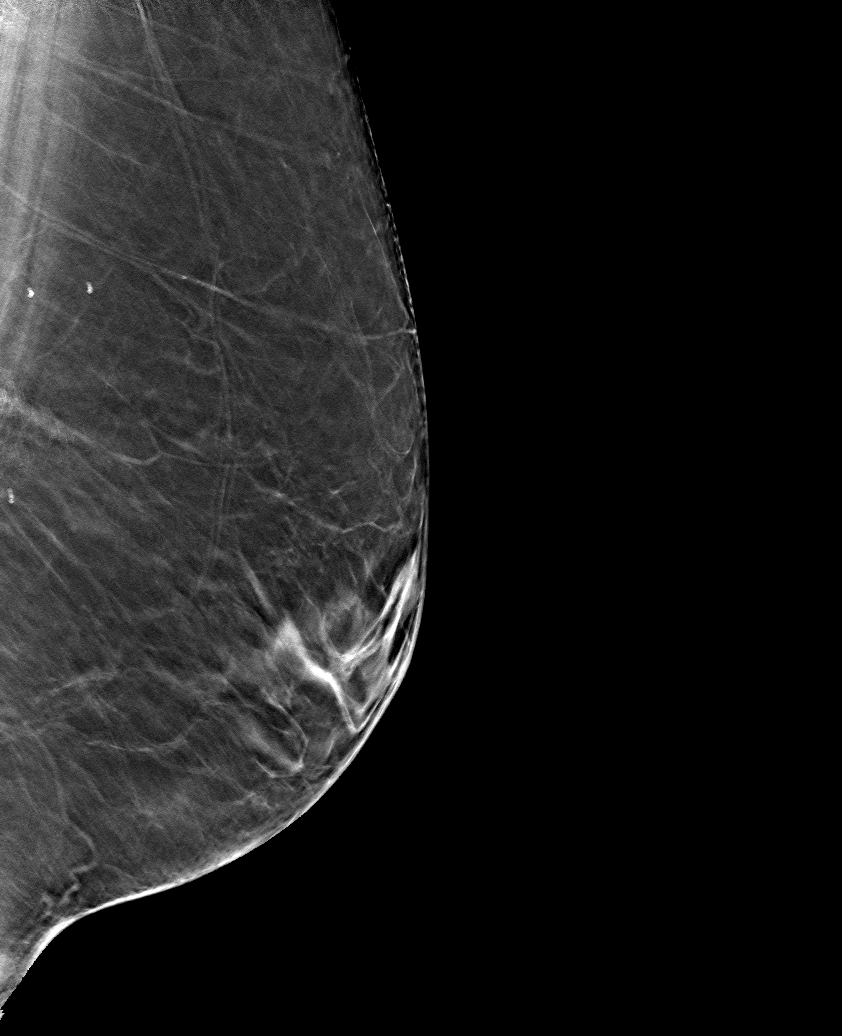

[6 of 30 positions shown; findings below may reference images not displayed]

ACR Breast Density Category b: There are scattered areas of
fibroglandular density.
FINDINGS: There are no findings suspicious for malignancy. Images were
processed with CAD.
IMPRESSION: No mammographic evidence of malignancy. A result letter of this
screening mammogram will be mailed directly to the patient.

RECOMMENDATION:
Screening mammogram in one year. (Code:[id number]-U-775)

BI-RADS CATEGORY  1: Negative.

## 2020-03-07 DIAGNOSIS — L918 Other hypertrophic disorders of the skin: Secondary | ICD-10-CM | POA: Diagnosis not present

## 2020-03-07 DIAGNOSIS — Z85820 Personal history of malignant melanoma of skin: Secondary | ICD-10-CM | POA: Diagnosis not present

## 2020-03-07 DIAGNOSIS — L570 Actinic keratosis: Secondary | ICD-10-CM | POA: Diagnosis not present

## 2020-03-07 DIAGNOSIS — L821 Other seborrheic keratosis: Secondary | ICD-10-CM | POA: Diagnosis not present

## 2020-03-07 DIAGNOSIS — Z859 Personal history of malignant neoplasm, unspecified: Secondary | ICD-10-CM | POA: Diagnosis not present

## 2020-03-07 DIAGNOSIS — Z872 Personal history of diseases of the skin and subcutaneous tissue: Secondary | ICD-10-CM | POA: Diagnosis not present

## 2020-03-07 DIAGNOSIS — Z85828 Personal history of other malignant neoplasm of skin: Secondary | ICD-10-CM | POA: Diagnosis not present

## 2020-04-17 DIAGNOSIS — T148XXA Other injury of unspecified body region, initial encounter: Secondary | ICD-10-CM | POA: Diagnosis not present

## 2020-04-24 DIAGNOSIS — L570 Actinic keratosis: Secondary | ICD-10-CM | POA: Diagnosis not present

## 2020-05-23 DIAGNOSIS — L814 Other melanin hyperpigmentation: Secondary | ICD-10-CM | POA: Diagnosis not present

## 2020-05-23 DIAGNOSIS — D225 Melanocytic nevi of trunk: Secondary | ICD-10-CM | POA: Diagnosis not present

## 2020-05-23 DIAGNOSIS — Z85828 Personal history of other malignant neoplasm of skin: Secondary | ICD-10-CM | POA: Diagnosis not present

## 2020-05-23 DIAGNOSIS — D2260 Melanocytic nevi of unspecified upper limb, including shoulder: Secondary | ICD-10-CM | POA: Diagnosis not present

## 2020-05-23 DIAGNOSIS — D2270 Melanocytic nevi of unspecified lower limb, including hip: Secondary | ICD-10-CM | POA: Diagnosis not present

## 2020-05-23 DIAGNOSIS — D1801 Hemangioma of skin and subcutaneous tissue: Secondary | ICD-10-CM | POA: Diagnosis not present

## 2020-05-23 DIAGNOSIS — L821 Other seborrheic keratosis: Secondary | ICD-10-CM | POA: Diagnosis not present

## 2020-05-23 DIAGNOSIS — L578 Other skin changes due to chronic exposure to nonionizing radiation: Secondary | ICD-10-CM | POA: Diagnosis not present

## 2020-05-23 DIAGNOSIS — Z1283 Encounter for screening for malignant neoplasm of skin: Secondary | ICD-10-CM | POA: Diagnosis not present

## 2020-05-23 DIAGNOSIS — D485 Neoplasm of uncertain behavior of skin: Secondary | ICD-10-CM | POA: Diagnosis not present

## 2020-06-04 DIAGNOSIS — E1122 Type 2 diabetes mellitus with diabetic chronic kidney disease: Secondary | ICD-10-CM | POA: Diagnosis not present

## 2020-06-04 DIAGNOSIS — E1169 Type 2 diabetes mellitus with other specified complication: Secondary | ICD-10-CM | POA: Diagnosis not present

## 2020-06-04 DIAGNOSIS — Z0000 Encounter for general adult medical examination without abnormal findings: Secondary | ICD-10-CM | POA: Diagnosis not present

## 2020-06-04 DIAGNOSIS — N183 Chronic kidney disease, stage 3 unspecified: Secondary | ICD-10-CM | POA: Diagnosis not present

## 2020-06-04 DIAGNOSIS — I10 Essential (primary) hypertension: Secondary | ICD-10-CM | POA: Diagnosis not present

## 2020-06-04 DIAGNOSIS — E785 Hyperlipidemia, unspecified: Secondary | ICD-10-CM | POA: Diagnosis not present

## 2020-06-11 DIAGNOSIS — Z23 Encounter for immunization: Secondary | ICD-10-CM | POA: Diagnosis not present

## 2020-06-11 DIAGNOSIS — I10 Essential (primary) hypertension: Secondary | ICD-10-CM | POA: Diagnosis not present

## 2020-06-11 DIAGNOSIS — E785 Hyperlipidemia, unspecified: Secondary | ICD-10-CM | POA: Diagnosis not present

## 2020-06-11 DIAGNOSIS — I429 Cardiomyopathy, unspecified: Secondary | ICD-10-CM | POA: Diagnosis not present

## 2020-06-11 DIAGNOSIS — E1169 Type 2 diabetes mellitus with other specified complication: Secondary | ICD-10-CM | POA: Diagnosis not present

## 2020-06-11 DIAGNOSIS — I2510 Atherosclerotic heart disease of native coronary artery without angina pectoris: Secondary | ICD-10-CM | POA: Diagnosis not present

## 2020-06-21 DIAGNOSIS — E083599 Diabetes mellitus due to underlying condition with proliferative diabetic retinopathy without macular edema, unspecified eye: Secondary | ICD-10-CM | POA: Diagnosis not present

## 2020-06-21 DIAGNOSIS — E038 Other specified hypothyroidism: Secondary | ICD-10-CM | POA: Diagnosis not present

## 2020-06-21 DIAGNOSIS — N183 Chronic kidney disease, stage 3 unspecified: Secondary | ICD-10-CM | POA: Diagnosis not present

## 2020-06-21 DIAGNOSIS — I5022 Chronic systolic (congestive) heart failure: Secondary | ICD-10-CM | POA: Diagnosis not present

## 2020-06-21 DIAGNOSIS — E1122 Type 2 diabetes mellitus with diabetic chronic kidney disease: Secondary | ICD-10-CM | POA: Diagnosis not present

## 2020-06-21 DIAGNOSIS — I2510 Atherosclerotic heart disease of native coronary artery without angina pectoris: Secondary | ICD-10-CM | POA: Diagnosis not present

## 2020-06-21 DIAGNOSIS — E1169 Type 2 diabetes mellitus with other specified complication: Secondary | ICD-10-CM | POA: Diagnosis not present

## 2020-06-21 DIAGNOSIS — E785 Hyperlipidemia, unspecified: Secondary | ICD-10-CM | POA: Diagnosis not present

## 2020-07-11 DIAGNOSIS — L988 Other specified disorders of the skin and subcutaneous tissue: Secondary | ICD-10-CM | POA: Diagnosis not present

## 2020-07-23 DIAGNOSIS — H401134 Primary open-angle glaucoma, bilateral, indeterminate stage: Secondary | ICD-10-CM | POA: Diagnosis not present

## 2020-07-29 DIAGNOSIS — J069 Acute upper respiratory infection, unspecified: Secondary | ICD-10-CM | POA: Diagnosis not present

## 2020-07-29 DIAGNOSIS — I5022 Chronic systolic (congestive) heart failure: Secondary | ICD-10-CM | POA: Diagnosis not present

## 2020-07-29 DIAGNOSIS — N183 Chronic kidney disease, stage 3 unspecified: Secondary | ICD-10-CM | POA: Diagnosis not present

## 2020-07-29 DIAGNOSIS — E1122 Type 2 diabetes mellitus with diabetic chronic kidney disease: Secondary | ICD-10-CM | POA: Diagnosis not present

## 2020-10-09 DIAGNOSIS — E038 Other specified hypothyroidism: Secondary | ICD-10-CM | POA: Diagnosis not present

## 2020-10-09 DIAGNOSIS — I10 Essential (primary) hypertension: Secondary | ICD-10-CM | POA: Diagnosis not present

## 2020-10-09 DIAGNOSIS — N183 Chronic kidney disease, stage 3 unspecified: Secondary | ICD-10-CM | POA: Diagnosis not present

## 2020-10-09 DIAGNOSIS — E1122 Type 2 diabetes mellitus with diabetic chronic kidney disease: Secondary | ICD-10-CM | POA: Diagnosis not present

## 2020-10-10 DIAGNOSIS — D1801 Hemangioma of skin and subcutaneous tissue: Secondary | ICD-10-CM | POA: Diagnosis not present

## 2020-10-10 DIAGNOSIS — D2270 Melanocytic nevi of unspecified lower limb, including hip: Secondary | ICD-10-CM | POA: Diagnosis not present

## 2020-10-10 DIAGNOSIS — D225 Melanocytic nevi of trunk: Secondary | ICD-10-CM | POA: Diagnosis not present

## 2020-10-10 DIAGNOSIS — L578 Other skin changes due to chronic exposure to nonionizing radiation: Secondary | ICD-10-CM | POA: Diagnosis not present

## 2020-10-10 DIAGNOSIS — L988 Other specified disorders of the skin and subcutaneous tissue: Secondary | ICD-10-CM | POA: Diagnosis not present

## 2020-10-10 DIAGNOSIS — L814 Other melanin hyperpigmentation: Secondary | ICD-10-CM | POA: Diagnosis not present

## 2020-10-10 DIAGNOSIS — L821 Other seborrheic keratosis: Secondary | ICD-10-CM | POA: Diagnosis not present

## 2020-10-10 DIAGNOSIS — D2260 Melanocytic nevi of unspecified upper limb, including shoulder: Secondary | ICD-10-CM | POA: Diagnosis not present

## 2020-10-10 DIAGNOSIS — Z85828 Personal history of other malignant neoplasm of skin: Secondary | ICD-10-CM | POA: Diagnosis not present

## 2020-10-10 DIAGNOSIS — Z1283 Encounter for screening for malignant neoplasm of skin: Secondary | ICD-10-CM | POA: Diagnosis not present

## 2020-10-10 DIAGNOSIS — L0100 Impetigo, unspecified: Secondary | ICD-10-CM | POA: Diagnosis not present

## 2020-10-16 DIAGNOSIS — I2510 Atherosclerotic heart disease of native coronary artery without angina pectoris: Secondary | ICD-10-CM | POA: Diagnosis not present

## 2020-10-16 DIAGNOSIS — E785 Hyperlipidemia, unspecified: Secondary | ICD-10-CM | POA: Diagnosis not present

## 2020-10-16 DIAGNOSIS — I5022 Chronic systolic (congestive) heart failure: Secondary | ICD-10-CM | POA: Diagnosis not present

## 2020-10-16 DIAGNOSIS — D510 Vitamin B12 deficiency anemia due to intrinsic factor deficiency: Secondary | ICD-10-CM | POA: Diagnosis not present

## 2020-10-16 DIAGNOSIS — I10 Essential (primary) hypertension: Secondary | ICD-10-CM | POA: Diagnosis not present

## 2020-10-16 DIAGNOSIS — E1169 Type 2 diabetes mellitus with other specified complication: Secondary | ICD-10-CM | POA: Diagnosis not present

## 2020-10-16 DIAGNOSIS — E1122 Type 2 diabetes mellitus with diabetic chronic kidney disease: Secondary | ICD-10-CM | POA: Diagnosis not present

## 2020-10-16 DIAGNOSIS — N183 Chronic kidney disease, stage 3 unspecified: Secondary | ICD-10-CM | POA: Diagnosis not present

## 2020-12-11 ENCOUNTER — Other Ambulatory Visit: Payer: Self-pay | Admitting: Internal Medicine

## 2020-12-11 DIAGNOSIS — Z1231 Encounter for screening mammogram for malignant neoplasm of breast: Secondary | ICD-10-CM

## 2020-12-19 DIAGNOSIS — I10 Essential (primary) hypertension: Secondary | ICD-10-CM | POA: Diagnosis not present

## 2020-12-19 DIAGNOSIS — N183 Chronic kidney disease, stage 3 unspecified: Secondary | ICD-10-CM | POA: Diagnosis not present

## 2020-12-19 DIAGNOSIS — E1122 Type 2 diabetes mellitus with diabetic chronic kidney disease: Secondary | ICD-10-CM | POA: Diagnosis not present

## 2020-12-19 DIAGNOSIS — I2510 Atherosclerotic heart disease of native coronary artery without angina pectoris: Secondary | ICD-10-CM | POA: Diagnosis not present

## 2020-12-19 DIAGNOSIS — E1169 Type 2 diabetes mellitus with other specified complication: Secondary | ICD-10-CM | POA: Diagnosis not present

## 2020-12-19 DIAGNOSIS — I429 Cardiomyopathy, unspecified: Secondary | ICD-10-CM | POA: Diagnosis not present

## 2020-12-19 DIAGNOSIS — Z9889 Other specified postprocedural states: Secondary | ICD-10-CM | POA: Diagnosis not present

## 2020-12-19 DIAGNOSIS — I5022 Chronic systolic (congestive) heart failure: Secondary | ICD-10-CM | POA: Diagnosis not present

## 2020-12-19 DIAGNOSIS — E785 Hyperlipidemia, unspecified: Secondary | ICD-10-CM | POA: Diagnosis not present

## 2021-01-13 ENCOUNTER — Other Ambulatory Visit: Payer: Self-pay

## 2021-01-13 ENCOUNTER — Ambulatory Visit
Admission: RE | Admit: 2021-01-13 | Discharge: 2021-01-13 | Disposition: A | Discharge status: Home / Self Care | Payer: PPO | Source: Ambulatory Visit | Attending: Internal Medicine | Admitting: Internal Medicine

## 2021-01-13 DIAGNOSIS — Z1231 Encounter for screening mammogram for malignant neoplasm of breast: Secondary | ICD-10-CM | POA: Insufficient documentation

## 2021-01-29 DIAGNOSIS — D1801 Hemangioma of skin and subcutaneous tissue: Secondary | ICD-10-CM | POA: Diagnosis not present

## 2021-01-29 DIAGNOSIS — L814 Other melanin hyperpigmentation: Secondary | ICD-10-CM | POA: Diagnosis not present

## 2021-01-29 DIAGNOSIS — D2260 Melanocytic nevi of unspecified upper limb, including shoulder: Secondary | ICD-10-CM | POA: Diagnosis not present

## 2021-01-29 DIAGNOSIS — L578 Other skin changes due to chronic exposure to nonionizing radiation: Secondary | ICD-10-CM | POA: Diagnosis not present

## 2021-01-29 DIAGNOSIS — L988 Other specified disorders of the skin and subcutaneous tissue: Secondary | ICD-10-CM | POA: Diagnosis not present

## 2021-01-29 DIAGNOSIS — Z1283 Encounter for screening for malignant neoplasm of skin: Secondary | ICD-10-CM | POA: Diagnosis not present

## 2021-01-29 DIAGNOSIS — D225 Melanocytic nevi of trunk: Secondary | ICD-10-CM | POA: Diagnosis not present

## 2021-01-29 DIAGNOSIS — D2270 Melanocytic nevi of unspecified lower limb, including hip: Secondary | ICD-10-CM | POA: Diagnosis not present

## 2021-01-29 DIAGNOSIS — Z85828 Personal history of other malignant neoplasm of skin: Secondary | ICD-10-CM | POA: Diagnosis not present

## 2021-01-29 DIAGNOSIS — L821 Other seborrheic keratosis: Secondary | ICD-10-CM | POA: Diagnosis not present

## 2021-01-29 DIAGNOSIS — D485 Neoplasm of uncertain behavior of skin: Secondary | ICD-10-CM | POA: Diagnosis not present

## 2021-02-05 DIAGNOSIS — H401134 Primary open-angle glaucoma, bilateral, indeterminate stage: Secondary | ICD-10-CM | POA: Diagnosis not present

## 2021-03-24 DIAGNOSIS — E1169 Type 2 diabetes mellitus with other specified complication: Secondary | ICD-10-CM | POA: Diagnosis not present

## 2021-03-24 DIAGNOSIS — E785 Hyperlipidemia, unspecified: Secondary | ICD-10-CM | POA: Diagnosis not present

## 2021-03-24 DIAGNOSIS — N183 Chronic kidney disease, stage 3 unspecified: Secondary | ICD-10-CM | POA: Diagnosis not present

## 2021-03-24 DIAGNOSIS — I10 Essential (primary) hypertension: Secondary | ICD-10-CM | POA: Diagnosis not present

## 2021-03-24 DIAGNOSIS — E1122 Type 2 diabetes mellitus with diabetic chronic kidney disease: Secondary | ICD-10-CM | POA: Diagnosis not present

## 2021-03-27 DIAGNOSIS — L988 Other specified disorders of the skin and subcutaneous tissue: Secondary | ICD-10-CM | POA: Diagnosis not present

## 2021-03-31 DIAGNOSIS — S0190XS Unspecified open wound of unspecified part of head, sequela: Secondary | ICD-10-CM | POA: Diagnosis not present

## 2021-03-31 DIAGNOSIS — E083599 Diabetes mellitus due to underlying condition with proliferative diabetic retinopathy without macular edema, unspecified eye: Secondary | ICD-10-CM | POA: Diagnosis not present

## 2021-03-31 DIAGNOSIS — C439 Malignant melanoma of skin, unspecified: Secondary | ICD-10-CM | POA: Diagnosis not present

## 2021-03-31 DIAGNOSIS — I2510 Atherosclerotic heart disease of native coronary artery without angina pectoris: Secondary | ICD-10-CM | POA: Diagnosis not present

## 2021-03-31 DIAGNOSIS — I5022 Chronic systolic (congestive) heart failure: Secondary | ICD-10-CM | POA: Diagnosis not present

## 2021-03-31 DIAGNOSIS — D510 Vitamin B12 deficiency anemia due to intrinsic factor deficiency: Secondary | ICD-10-CM | POA: Diagnosis not present

## 2021-03-31 DIAGNOSIS — E1169 Type 2 diabetes mellitus with other specified complication: Secondary | ICD-10-CM | POA: Diagnosis not present

## 2021-03-31 DIAGNOSIS — I10 Essential (primary) hypertension: Secondary | ICD-10-CM | POA: Diagnosis not present

## 2021-03-31 DIAGNOSIS — E1122 Type 2 diabetes mellitus with diabetic chronic kidney disease: Secondary | ICD-10-CM | POA: Diagnosis not present

## 2021-03-31 DIAGNOSIS — I429 Cardiomyopathy, unspecified: Secondary | ICD-10-CM | POA: Diagnosis not present

## 2021-03-31 DIAGNOSIS — Z0000 Encounter for general adult medical examination without abnormal findings: Secondary | ICD-10-CM | POA: Diagnosis not present

## 2021-03-31 DIAGNOSIS — E038 Other specified hypothyroidism: Secondary | ICD-10-CM | POA: Diagnosis not present

## 2021-04-02 DIAGNOSIS — E038 Other specified hypothyroidism: Secondary | ICD-10-CM | POA: Diagnosis not present

## 2021-04-02 DIAGNOSIS — E785 Hyperlipidemia, unspecified: Secondary | ICD-10-CM | POA: Diagnosis not present

## 2021-04-02 DIAGNOSIS — I2510 Atherosclerotic heart disease of native coronary artery without angina pectoris: Secondary | ICD-10-CM | POA: Diagnosis not present

## 2021-04-02 DIAGNOSIS — E1169 Type 2 diabetes mellitus with other specified complication: Secondary | ICD-10-CM | POA: Diagnosis not present

## 2021-04-02 DIAGNOSIS — I5022 Chronic systolic (congestive) heart failure: Secondary | ICD-10-CM | POA: Diagnosis not present

## 2021-04-02 DIAGNOSIS — N183 Chronic kidney disease, stage 3 unspecified: Secondary | ICD-10-CM | POA: Diagnosis not present

## 2021-04-02 DIAGNOSIS — E1122 Type 2 diabetes mellitus with diabetic chronic kidney disease: Secondary | ICD-10-CM | POA: Diagnosis not present

## 2021-04-02 DIAGNOSIS — C439 Malignant melanoma of skin, unspecified: Secondary | ICD-10-CM | POA: Diagnosis not present

## 2021-04-02 DIAGNOSIS — I429 Cardiomyopathy, unspecified: Secondary | ICD-10-CM | POA: Diagnosis not present

## 2021-04-10 ENCOUNTER — Other Ambulatory Visit: Payer: Self-pay

## 2021-04-10 ENCOUNTER — Encounter: Payer: PPO | Attending: Physician Assistant | Admitting: Physician Assistant

## 2021-04-10 DIAGNOSIS — L98499 Non-pressure chronic ulcer of skin of other sites with unspecified severity: Secondary | ICD-10-CM | POA: Diagnosis not present

## 2021-04-10 DIAGNOSIS — E11622 Type 2 diabetes mellitus with other skin ulcer: Secondary | ICD-10-CM | POA: Diagnosis not present

## 2021-04-10 DIAGNOSIS — L131 Subcorneal pustular dermatitis: Secondary | ICD-10-CM | POA: Diagnosis not present

## 2021-04-10 DIAGNOSIS — L98492 Non-pressure chronic ulcer of skin of other sites with fat layer exposed: Secondary | ICD-10-CM | POA: Diagnosis not present

## 2021-04-10 DIAGNOSIS — L988 Other specified disorders of the skin and subcutaneous tissue: Secondary | ICD-10-CM | POA: Diagnosis not present

## 2021-04-15 NOTE — Progress Notes (Signed)
Meyn, Toneisha M. ([id number]) Visit Report for 04/10/2021 Allergy List Details Patient Name: Erin Mccoy, Erin Mccoy. Date of Service: 04/10/2021 8:45 AM Medical Record Number: [id number] Patient Account Number: [id number] Date of Birth/Sex: 09/10/1950 (70 y.o. F) Treating RN: Carlene Coria Primary Care Micala Saltsman: Frazier Richards Other Clinician: Referring Ottilie Wigglesworth: Frazier Richards Treating Jazira Maloney/Extender: Jeri Cos Weeks in Treatment: 0 Allergies Active Allergies No Known Allergies Allergy Notes Electronic Signature(s) Signed: 04/14/2021 6:56:11 PM By: Carlene Coria RN Entered By: Carlene Coria on 04/10/2021 09:12:29 Buntrock, Khamani M. ([id number]) -------------------------------------------------------------------------------- Arrival Information Details Patient Name: Erin Mccoy, Erin Mccoy. Date of Service: 04/10/2021 8:45 AM Medical Record Number: [id number] Patient Account Number: [id number] Date of Birth/Sex: 1950-07-02 (70 y.o. F) Treating RN: Carlene Coria Primary Care Ki Corbo: Frazier Richards Other Clinician: Referring Orilla Templeman: Frazier Richards Treating Makhia Vosler/Extender: Skipper Cliche in Treatment: 0 Visit Information Patient Arrived: Ambulatory Arrival Time: 09:10 Accompanied By: husband Transfer Assistance: None Patient Identification Verified: Yes Secondary Verification Process Completed: Yes Patient Requires Transmission-Based Precautions: No Patient Has Alerts: No Electronic Signature(s) Signed: 04/14/2021 6:56:11 PM By: Carlene Coria RN Entered By: Carlene Coria on 04/10/2021 09:11:20 Zhan, Senya M. ([id number]) -------------------------------------------------------------------------------- Clinic Level of Care Assessment Details Patient Name: Erin Mccoy, Erin Mccoy. Date of Service: 04/10/2021 8:45 AM Medical Record Number: [id number] Patient Account Number: [id number] Date of Birth/Sex: July 31, 1950 (70 y.o. F) Treating RN: Carlene Coria Primary  Care Carmel Garfield: Frazier Richards Other Clinician: Referring Alvina Strother: Frazier Richards Treating Joellyn Grandt/Extender: Skipper Cliche in Treatment: 0 Clinic Level of Care Assessment Items TOOL 4 Quantity Score X - Use when only an EandM is performed on FOLLOW-UP visit 1 0 ASSESSMENTS - Nursing Assessment / Reassessment X - Reassessment of Co-morbidities (includes updates in patient status) 1 10 X- 1 5 Reassessment of Adherence to Treatment Plan ASSESSMENTS - Wound and Skin Assessment / Reassessment X - Simple Wound Assessment / Reassessment - one wound 1 5 []  - 0 Complex Wound Assessment / Reassessment - multiple wounds []  - 0 Dermatologic / Skin Assessment (not related to wound area) ASSESSMENTS - Focused Assessment []  - Circumferential Edema Measurements - multi extremities 0 []  - 0 Nutritional Assessment / Counseling / Intervention []  - 0 Lower Extremity Assessment (monofilament, tuning fork, pulses) []  - 0 Peripheral Arterial Disease Assessment (using hand held doppler) ASSESSMENTS - Ostomy and/or Continence Assessment and Care []  - Incontinence Assessment and Management 0 []  - 0 Ostomy Care Assessment and Management (repouching, etc.) PROCESS - Coordination of Care X - Simple Patient / Family Education for ongoing care 1 15 []  - 0 Complex (extensive) Patient / Family Education for ongoing care X- 1 10 Staff obtains Programmer, systems, Records, Test Results / Process Orders []  - 0 Staff telephones HHA, Nursing Homes / Clarify orders / etc []  - 0 Routine Transfer to another Facility (non-emergent condition) []  - 0 Routine Hospital Admission (non-emergent condition) []  - 0 New Admissions / Biomedical engineer / Ordering NPWT, Apligraf, etc. []  - 0 Emergency Hospital Admission (emergent condition) X- 1 10 Simple Discharge Coordination []  - 0 Complex (extensive) Discharge Coordination PROCESS - Special Needs []  - Pediatric / Minor Patient Management 0 []  - 0 Isolation  Patient Management []  - 0 Hearing / Language / Visual special needs []  - 0 Assessment of Community assistance (transportation, D/C planning, etc.) []  - 0 Additional assistance / Altered mentation []  - 0 Support Surface(s) Assessment (bed, cushion, seat, etc.) INTERVENTIONS - Wound Cleansing / Measurement Mccoy, Erin M. ([id number]) X- 1 5 Simple Wound Cleansing - one wound []  - 0  Complex Wound Cleansing - multiple wounds X- 1 5 Wound Imaging (photographs - any number of wounds) []  - 0 Wound Tracing (instead of photographs) X- 1 5 Simple Wound Measurement - one wound []  - 0 Complex Wound Measurement - multiple wounds INTERVENTIONS - Wound Dressings []  - Small Wound Dressing one or multiple wounds 0 []  - 0 Medium Wound Dressing one or multiple wounds []  - 0 Large Wound Dressing one or multiple wounds []  - 0 Application of Medications - topical []  - 0 Application of Medications - injection INTERVENTIONS - Miscellaneous []  - External ear exam 0 []  - 0 Specimen Collection (cultures, biopsies, blood, body fluids, etc.) []  - 0 Specimen(s) / Culture(s) sent or taken to Lab for analysis []  - 0 Patient Transfer (multiple staff / Civil Service fast streamer / Similar devices) []  - 0 Simple Staple / Suture removal (25 or less) []  - 0 Complex Staple / Suture removal (26 or more) []  - 0 Hypo / Hyperglycemic Management (close monitor of Blood Glucose) []  - 0 Ankle / Brachial Index (ABI) - do not check if billed separately X- 1 5 Vital Signs Has the patient been seen at the hospital within the last three years: Yes Total Score: 75 Level Of Care: New/Established - Level 2 Electronic Signature(s) Signed: 04/14/2021 6:56:11 PM By: Carlene Coria RN Entered By: Carlene Coria on 04/10/2021 10:03:13 Tosi, Ibeth M. ([id number]) -------------------------------------------------------------------------------- Encounter Discharge Information Details Patient Name: Erin Mccoy, Erin Mccoy. Date of  Service: 04/10/2021 8:45 AM Medical Record Number: [id number] Patient Account Number: [id number] Date of Birth/Sex: 1951-03-28 (70 y.o. F) Treating RN: Carlene Coria Primary Care Salbador Fiveash: Frazier Richards Other Clinician: Referring Alicianna Litchford: Frazier Richards Treating Jerimey Burridge/Extender: Skipper Cliche in Treatment: 0 Encounter Discharge Information Items Discharge Condition: Stable Ambulatory Status: Ambulatory Discharge Destination: Home Transportation: Private Auto Accompanied By: husband Schedule Follow-up Appointment: Yes Clinical Summary of Care: Patient Declined Electronic Signature(s) Signed: 04/14/2021 6:56:11 PM By: Carlene Coria RN Entered By: Carlene Coria on 04/10/2021 10:05:53 Ahonen, Auset M. ([id number]) -------------------------------------------------------------------------------- Lower Extremity Assessment Details Patient Name: Erin Mccoy, Erin Mccoy. Date of Service: 04/10/2021 8:45 AM Medical Record Number: [id number] Patient Account Number: [id number] Date of Birth/Sex: 07-22-1950 (70 y.o. F) Treating RN: Carlene Coria Primary Care Stormi Vandevelde: Frazier Richards Other Clinician: Referring Ltanya Bayley: Frazier Richards Treating Peyton Rossner/Extender: Jeri Cos Weeks in Treatment: 0 Electronic Signature(s) Signed: 04/14/2021 6:56:11 PM By: Carlene Coria RN Entered By: Carlene Coria on 04/10/2021 09:18:24 Wacha, Delfina M. ([id number]) -------------------------------------------------------------------------------- Multi Wound Chart Details Patient Name: Erin Mccoy, Erin Mccoy. Date of Service: 04/10/2021 8:45 AM Medical Record Number: [id number] Patient Account Number: [id number] Date of Birth/Sex: [birth year] (70 y.o. F) Treating RN: Carlene Coria Primary Care Danah Reinecke: Frazier Richards Other Clinician: Referring Abella Shugart: Frazier Richards Treating Francesca Strome/Extender: Skipper Cliche in Treatment: 0 Vital Signs Height(in): 58 Pulse(bpm): 70 Weight(lbs):  101 Blood Pressure(mmHg): 101/63 Body Mass Index(BMI): 21 Temperature(F): 98.3 Respiratory Rate(breaths/min): 18 Photos: [N/A:N/A] Wound Location: Head - Parietal N/A N/A Wounding Event: Not Known N/A N/A Primary Etiology: Atypical N/A N/A Comorbid History: Congestive Heart Failure, Coronary N/A N/A Artery Disease, Hypertension, Myocardial Infarction, Type II Diabetes Date Acquired: 12/13/2020 N/A N/A Weeks of Treatment: 0 N/A N/A Wound Status: Open N/A N/A Clustered Wound: Yes N/A N/A Measurements L x W x D (cm) 9x9x0.1 N/A N/A Area (cm) : 63.617 N/A N/A Volume (cm) : 6.362 N/A N/A Classification: Full Thickness Without Exposed N/A N/A Support Structures Exudate Amount: Medium N/A N/A Exudate Type: Serosanguineous N/A N/A Exudate Color: red, brown N/A N/A Granulation Amount: None  Present (0%) N/A N/A Necrotic Amount: Large (67-100%) N/A N/A Exposed Structures: Fat Layer (Subcutaneous Tissue): N/A N/A Yes Fascia: No Tendon: No Muscle: No Joint: No Bone: No Epithelialization: None N/A N/A Treatment Notes Electronic Signature(s) Signed: 04/14/2021 6:56:11 PM By: Carlene Coria RN Entered By: Carlene Coria on 04/10/2021 09:56:09 Sisneros, Jakelin M. ([id number]) -------------------------------------------------------------------------------- [place] Details Patient Name: Erin Mccoy, Erin Mccoy. Date of Service: 04/10/2021 8:45 AM Medical Record Number: [id number] Patient Account Number: [id number] Date of Birth/Sex: 1950/07/22 (70 y.o. F) Treating RN: Carlene Coria Primary Care Emmalynne Courtney: Frazier Richards Other Clinician: Referring Wymon Swaney: Frazier Richards Treating Arshi Duarte/Extender: Jeri Cos Weeks in Treatment: 0 Active Inactive Electronic Signature(s) Signed: 04/14/2021 6:56:11 PM By: Carlene Coria RN Entered By: Carlene Coria on 04/10/2021 10:06:22 Yeomans, Almee M.  ([id number]) -------------------------------------------------------------------------------- Pain Assessment Details Patient Name: Erin Mccoy, Erin Mccoy. Date of Service: 04/10/2021 8:45 AM Medical Record Number: [id number] Patient Account Number: [id number] Date of Birth/Sex: [birth year] (70 y.o. F) Treating RN: Carlene Coria Primary Care Bertice Risse: Frazier Richards Other Clinician: Referring Ramina Hulet: Frazier Richards Treating Natividad Halls/Extender: Skipper Cliche in Treatment: 0 Active Problems Location of Pain Severity and Description of Pain Patient Has Paino No Site Locations Pain Management and Medication Current Pain Management: Electronic Signature(s) Signed: 04/14/2021 6:56:11 PM By: Carlene Coria RN Entered By: Carlene Coria on 04/10/2021 09:11:32 Devol, Maite M. ([id number]) -------------------------------------------------------------------------------- Patient/Caregiver Education Details Patient Name: Erin Mccoy, Erin Mccoy. Date of Service: 04/10/2021 8:45 AM Medical Record Number: [id number] Patient Account Number: [id number] Date of Birth/Gender: [birth year] (70 y.o. F) Treating RN: Carlene Coria Primary Care Physician: Frazier Richards Other Clinician: Referring Physician: Frazier Richards Treating Physician/Extender: Skipper Cliche in Treatment: 0 Education Assessment Education Provided To: Patient Education Topics Provided Wound/Skin Impairment: Methods: Explain/Verbal Responses: State content correctly Electronic Signature(s) Signed: 04/14/2021 6:56:11 PM By: Carlene Coria RN Entered By: Carlene Coria on 04/10/2021 10:03:37 Mccoy, Erin M. ([id number]) -------------------------------------------------------------------------------- Wound Assessment Details Patient Name: Erin Mccoy, Erin Mccoy. Date of Service: 04/10/2021 8:45 AM Medical Record Number: [id number] Patient Account Number: [id number] Date of Birth/Sex: 11/08/1950 (70 y.o. F) Treating RN: Carlene Coria Primary Care Sherlock Nancarrow: Frazier Richards Other Clinician: Referring Cia Garretson: Frazier Richards Treating Abigale Dorow/Extender: Skipper Cliche in Treatment: 0 Wound Status Wound Number: 1 Primary Atypical Etiology: Wound Location: Head - Parietal Wound Open Wounding Event: Not Known Status: Date Acquired: 12/13/2020 Comorbid Congestive Heart Failure, Coronary Artery Disease, Weeks Of Treatment: 0 History: Hypertension, Myocardial Infarction, Type II Diabetes Clustered Wound: Yes Photos Wound Measurements Length: (cm) 9 Width: (cm) 9 Depth: (cm) 0.1 Area: (cm) 63.617 Volume: (cm) 6.362 % Reduction in Area: % Reduction in Volume: Epithelialization: None Tunneling: No Undermining: No Wound Description Classification: Full Thickness Without Exposed Support Structu Exudate Amount: Medium Exudate Type: Serosanguineous Exudate Color: red, brown res Foul Odor After Cleansing: No Slough/Fibrino Yes Wound Bed Granulation Amount: None Present (0%) Exposed Structure Necrotic Amount: Large (67-100%) Fascia Exposed: No Necrotic Quality: Adherent Slough Fat Layer (Subcutaneous Tissue) Exposed: Yes Tendon Exposed: No Muscle Exposed: No Joint Exposed: No Bone Exposed: No Electronic Signature(s) Signed: 04/14/2021 6:56:11 PM By: Carlene Coria RN Entered By: Carlene Coria on 04/10/2021 09:23:25 Erin Mccoy, Myriam M. ([id number]) -------------------------------------------------------------------------------- Vitals Details Patient Name: DESHAE, DICKISON. Date of Service: 04/10/2021 8:45 AM Medical Record Number: [id number] Patient Account Number: [id number] Date of Birth/Sex: 1950-10-11 (70 y.o. F) Treating RN: Carlene Coria Primary Care Jaramie Bastos: Frazier Richards Other Clinician: Referring Anyelin Mogle: Frazier Richards Treating Ashiah Karpowicz/Extender: Skipper Cliche in Treatment: 0 Vital Signs Time Taken: 09:11 Temperature (F): 98.3 Height (in):  58 Pulse (bpm):  70 Source: Stated Respiratory Rate (breaths/min): 18 Weight (lbs): 101 Blood Pressure (mmHg): 101/63 Source: Stated Reference Range: 80 - 120 mg / dl Body Mass Index (BMI): 21.1 Electronic Signature(s) Signed: 04/14/2021 6:56:11 PM By: Carlene Coria RN Entered By: Carlene Coria on 04/10/2021 09:12:10

## 2021-04-15 NOTE — Progress Notes (Signed)
Erin Mccoy, Erin M. ([id number]) Visit Report for 04/10/2021 Chief Complaint Document Details Patient Name: Erin Mccoy, Erin Mccoy. Date of Service: 04/10/2021 8:45 AM Medical Record Number: [id number] Patient Account Number: [id number] Date of Birth/Sex: 18-Apr-1951 (70 y.o. F) Treating RN: Carlene Coria Primary Care Provider: Frazier Richards Other Clinician: Referring Provider: Frazier Richards Treating Provider/Extender: Skipper Cliche in Treatment: 0 Information Obtained from: Patient Chief Complaint erosive pustular dermatosis Electronic Signature(s) Signed: 04/10/2021 10:07:00 AM By: Worthy Keeler PA-C Entered By: Worthy Keeler on 04/10/2021 10:06:59 [name], [name] ([id number]) -------------------------------------------------------------------------------- HPI Details Patient Name: Erin Mccoy, Erin Mccoy. Date of Service: 04/10/2021 8:45 AM Medical Record Number: [id number] Patient Account Number: [id number] Date of Birth/Sex: 22-Feb-1951 (70 y.o. F) Treating RN: Carlene Coria Primary Care Provider: Frazier Richards Other Clinician: Referring Provider: Frazier Richards Treating Provider/Extender: Skipper Cliche in Treatment: 0 History of Present Illness HPI Description: 04/10/2021 upon evaluation today patient presents today for an ongoing issue that she has been having with wounds on her scalp. This has been something that she has been seen at [facility] system fairly consistently for in the past several months especially. With that being said currently at [facility] dermatology that she has been seen. Most recent appointment was actually 03/27/2021 and the patient was actually seen by Dr. Vangie Bicker MD. Subsequently I did review the note and the diagnosis that the patient has is erosive pustular dermatosis. This is noted to have been present for several years and causes her to be itchy and painful at times. Previously noted and tried therapies included  clobetasol, doxycycline, and 5-fluorouracil. Subsequently the patient is also had multiple skin cancers that he has had removed along with other disease in the past as well. Currently she is using the clobetasol. She has not been on any recent antibiotics such as doxycycline. As far as the most recent biopsy which was on 05/23/2020 she did have a diagnosis on a shave biopsy of the right side scalp of actinic keratosis and scarring. Nonetheless the plan is that the patient does have erosive pustular dermatosis and subsequently that it is noted that this is a chronic and ongoing issue that is not can have a permanent fix. Topical steroids being the potential and potent first-line treatment that is what is been attempted at this point. Other options and reserve that were noted included topical tacrolimus, topical, Calcipotrioll, intralesional Kenalog injections, topical retinoids, topical dapsone, oral dapsone although this is stated to be a problem potentially due to age, and a short course of systemic steroids. Electronic Signature(s) Signed: 04/10/2021 4:40:54 PM By: Worthy Keeler PA-C Entered By: Worthy Keeler on 04/10/2021 16:40:54 [name], [name] ([id number]) -------------------------------------------------------------------------------- Physical Exam Details Patient Name: Erin Mccoy, Erin Mccoy. Date of Service: 04/10/2021 8:45 AM Medical Record Number: [id number] Patient Account Number: [id number] Date of Birth/Sex: 10/18/1950 (70 y.o. F) Treating RN: Carlene Coria Primary Care Provider: Frazier Richards Other Clinician: Referring Provider: Frazier Richards Treating Provider/Extender: Skipper Cliche in Treatment: 0 Constitutional sitting or standing blood pressure is within target range for patient.. pulse regular and within target range for patient.Marland Kitchen respirations regular, non- labored and within target range for patient.Marland Kitchen temperature within target range for patient..  Well-nourished and well-hydrated in no acute distress. Eyes conjunctiva clear no eyelid edema noted. pupils equal round and reactive to light and accommodation. Ears, Nose, Mouth, and Throat no gross abnormality of ear auricles or external auditory canals. normal hearing noted during conversation. mucus membranes moist. Respiratory normal breathing without difficulty. Psychiatric  this patient is able to make decisions and demonstrates good insight into disease process. Alert and Oriented x 3. pleasant and cooperative. Notes Upon inspection patient does have areas that are kind of dry and crusted pustular lesions over the scalp area. Fortunately there does not appear to be any signs of active infection at this time that is obvious. There is also not as much inflammation as I expected to see to be perfectly honest. Nonetheless she does have significant irritation here and I do believe this is an ongoing chronic issue her husband and the patient are concerned that this may be an ongoing issue and they really want something that could fix it although I am not sure that there is a legitimate fax that will cause this never to be an issue again. I discussed all this with the patient and her husband today during the office visit. Electronic Signature(s) Signed: 04/10/2021 4:42:03 PM By: Worthy Keeler PA-C Entered By: Worthy Keeler on 04/10/2021 16:42:03 [name], [name] ([id number]) -------------------------------------------------------------------------------- Physician Orders Details Patient Name: Erin Mccoy, Erin Mccoy. Date of Service: 04/10/2021 8:45 AM Medical Record Number: [id number] Patient Account Number: [id number] Date of Birth/Sex: [birth year] (70 y.o. F) Treating RN: Carlene Coria Primary Care Provider: Frazier Richards Other Clinician: Referring Provider: Frazier Richards Treating Provider/Extender: Skipper Cliche in Treatment: 0 Verbal / Phone Orders: No Diagnosis  Coding Discharge From [facility] Only - follow up with [facility] as scheduled , patient to take prednisone as ordered . Patient Medications Allergies: No Known Allergies Notifications Medication Indication Start End prednisone 04/10/2021 DOSE 1 - oral 50 mg tablet - 1 tablet oral taken 1 time per day in the morning after breakfast. Do not take Aspirin while on this medication Electronic Signature(s) Signed: 04/10/2021 10:10:14 AM By: Worthy Keeler PA-C Entered By: Worthy Keeler on 04/10/2021 10:10:13 Leap, Lilybeth M. ([id number]) -------------------------------------------------------------------------------- Problem List Details Patient Name: Erin Mccoy, Erin Mccoy. Date of Service: 04/10/2021 8:45 AM Medical Record Number: [id number] Patient Account Number: [id number] Date of Birth/Sex: 1950-12-09 (70 y.o. F) Treating RN: Carlene Coria Primary Care Provider: Frazier Richards Other Clinician: Referring Provider: Frazier Richards Treating Provider/Extender: Skipper Cliche in Treatment: 0 Active Problems ICD-10 Encounter Code Description Active Date MDM Diagnosis L13.1 Subcorneal pustular dermatitis 04/10/2021 No Yes L98.8 Other specified disorders of the skin and subcutaneous tissue 04/10/2021 No Yes E11.622 Type 2 diabetes mellitus with other skin ulcer 04/10/2021 No Yes Inactive Problems Resolved Problems Electronic Signature(s) Signed: 04/10/2021 10:07:27 AM By: Worthy Keeler PA-C Previous Signature: 04/10/2021 10:06:09 AM Version By: Worthy Keeler PA-C Entered By: Worthy Keeler on 04/10/2021 10:07:26 Morrisette, Hampton M. ([id number]) -------------------------------------------------------------------------------- Progress Note Details Patient Name: Erin Mccoy, Erin Mccoy. Date of Service: 04/10/2021 8:45 AM Medical Record Number: [id number] Patient Account Number: [id number] Date of Birth/Sex: [birth year] (70 y.o. F) Treating RN: Carlene Coria Primary Care  Provider: Frazier Richards Other Clinician: Referring Provider: Frazier Richards Treating Provider/Extender: Skipper Cliche in Treatment: 0 Subjective Chief Complaint Information obtained from Patient erosive pustular dermatosis History of Present Illness (HPI) 04/10/2021 upon evaluation today patient presents today for an ongoing issue that she has been having with wounds on her scalp. This has been something that she has been seen at [facility] system fairly consistently for in the past several months especially. With that being said currently at [facility] dermatology that she has been seen. Most recent appointment was actually 03/27/2021 and the patient was actually seen by Dr. Vangie Bicker MD. Subsequently  I did review the note and the diagnosis that the patient has is erosive pustular dermatosis. This is noted to have been present for several years and causes her to be itchy and painful at times. Previously noted and tried therapies included clobetasol, doxycycline, and 5-fluorouracil. Subsequently the patient is also had multiple skin cancers that he has had removed along with other disease in the past as well. Currently she is using the clobetasol. She has not been on any recent antibiotics such as doxycycline. As far as the most recent biopsy which was on 05/23/2020 she did have a diagnosis on a shave biopsy of the right side scalp of actinic keratosis and scarring. Nonetheless the plan is that the patient does have erosive pustular dermatosis and subsequently that it is noted that this is a chronic and ongoing issue that is not can have a permanent fix. Topical steroids being the potential and potent first-line treatment that is what is been attempted at this point. Other options and reserve that were noted included topical tacrolimus, topical, Calcipotrioll, intralesional Kenalog injections, topical retinoids, topical dapsone, oral dapsone although this is stated to be a  problem potentially due to age, and a short course of systemic steroids. Patient History Allergies No Known Allergies Social History Never smoker, Marital Status - Married, Alcohol Use - Never, Drug Use - No History, Caffeine Use - Never. Medical History Cardiovascular Patient has history of Congestive Heart Failure, Coronary Artery Disease, Hypertension, Myocardial Infarction Endocrine Patient has history of Type II Diabetes Patient is treated with Oral Agents. Blood sugar is not tested. Review of Systems (ROS) Integumentary (Skin) Complains or has symptoms of Wounds. Objective Constitutional sitting or standing blood pressure is within target range for patient.. pulse regular and within target range for patient.Marland Kitchen respirations regular, non- labored and within target range for patient.Marland Kitchen temperature within target range for patient.. Well-nourished and well-hydrated in no acute distress. Vitals Time Taken: 9:11 AM, Height: 58 in, Source: Stated, Weight: 101 lbs, Source: Stated, BMI: 21.1, Temperature: 98.3 F, Pulse: 70 bpm, Respiratory Rate: 18 breaths/min, Blood Pressure: 101/63 mmHg. Eyes conjunctiva clear no eyelid edema noted. pupils equal round and reactive to light and accommodation. Ears, Nose, Mouth, and Throat no gross abnormality of ear auricles or external auditory canals. normal hearing noted during conversation. mucus membranes moist. Erin Mccoy, Erin M. ([id number]) Respiratory normal breathing without difficulty. Psychiatric this patient is able to make decisions and demonstrates good insight into disease process. Alert and Oriented x 3. pleasant and cooperative. General Notes: Upon inspection patient does have areas that are kind of dry and crusted pustular lesions over the scalp area. Fortunately there does not appear to be any signs of active infection at this time that is obvious. There is also not as much inflammation as I expected to see to be perfectly honest.  Nonetheless she does have significant irritation here and I do believe this is an ongoing chronic issue her husband and the patient are concerned that this may be an ongoing issue and they really want something that could fix it although I am not sure that there is a legitimate fax that will cause this never to be an issue again. I discussed all this with the patient and her husband today during the office visit. Integumentary (Hair, Skin) Wound #1 status is Open. Original cause of wound was Not Known. The date acquired was: 12/13/2020. The wound is located on the Head - Parietal. The wound measures 9cm length x 9cm width x 0.1cm  depth; 63.617cm^2 area and 6.362cm^3 volume. There is Fat Layer (Subcutaneous Tissue) exposed. There is no tunneling or undermining noted. There is a medium amount of serosanguineous drainage noted. There is no granulation within the wound bed. There is a large (67-100%) amount of necrotic tissue within the wound bed including Adherent Slough. Assessment Active Problems ICD-10 Subcorneal pustular dermatitis Other specified disorders of the skin and subcutaneous tissue Type 2 diabetes mellitus with other skin ulcer Plan Discharge From [facility] Services: Consult Only - follow up with [facility] as scheduled , patient to take prednisone as ordered . The following medication(s) was prescribed: prednisone oral 50 mg tablet 1 1 tablet oral taken 1 time per day in the morning after breakfast. Do not take Aspirin while on this medication starting 04/10/2021 1. Based on what I am seeing currently I did recommend that I could give the patient a short course of prednisone in order to try to help out with any inflammation and possibly get this under little bit better control they were in agreement with giving this a trial. That we 50 mg 1 time a day for 7 days taken first time in the morning with breakfast. I did advise her of the potential side effects in association with the prednisone. This  was done in the presence of her husband as well. 2. Also recommended that if this does not improve significantly with the current regimen that I would recommend that she go to the dermatologist back at [facility] to follow-up and see what other options they can initiate or tried to try to get this under better control. She voiced understanding. We will see her back for a follow-up visit as needed. Electronic Signature(s) Signed: 04/10/2021 4:43:09 PM By: Worthy Keeler PA-C Entered By: Worthy Keeler on 04/10/2021 16:43:08 Erin Mccoy, Erin M. ([id number]) -------------------------------------------------------------------------------- ROS/PFSH Details Patient Name: Erin Mccoy, Erin Mccoy. Date of Service: 04/10/2021 8:45 AM Medical Record Number: [id number] Patient Account Number: [id number] Date of Birth/Sex: May 29, 1951 (70 y.o. F) Treating RN: Carlene Coria Primary Care Provider: Frazier Richards Other Clinician: Referring Provider: Frazier Richards Treating Provider/Extender: Skipper Cliche in Treatment: 0 Integumentary (Skin) Complaints and Symptoms: Positive for: Wounds Cardiovascular Medical History: Positive for: Congestive Heart Failure; Coronary Artery Disease; Hypertension; Myocardial Infarction Endocrine Medical History: Positive for: Type II Diabetes Time with diabetes: 61 Treated with: Oral agents Blood sugar tested every day: No Immunizations Pneumococcal Vaccine: Received Pneumococcal Vaccination: Yes Received Pneumococcal Vaccination On or After 60th Birthday: Yes Implantable Devices None Family and Social History Never smoker; Marital Status - Married; Alcohol Use: Never; Drug Use: No History; Caffeine Use: Never; Financial Concerns: No; Food, Clothing or Shelter Needs: No; Support System Lacking: No; Transportation Concerns: No Electronic Signature(s) Signed: 04/10/2021 4:43:43 PM By: Worthy Keeler PA-C Signed: 04/14/2021 6:56:11 PM By: Carlene Coria  RN Entered By: Carlene Coria on 04/10/2021 09:16:18 Erin Mccoy, Erin M. ([id number]) -------------------------------------------------------------------------------- SuperBill Details Patient Name: Erin Mccoy, Erin Mccoy. Date of Service: 04/10/2021 Medical Record Number: [id number] Patient Account Number: [id number] Date of Birth/Sex: 11-22-1950 (70 y.o. F) Treating RN: Carlene Coria Primary Care Provider: Frazier Richards Other Clinician: Referring Provider: Frazier Richards Treating Provider/Extender: Skipper Cliche in Treatment: 0 Diagnosis Coding ICD-10 Codes Code Description L13.1 Subcorneal pustular dermatitis L98.8 Other specified disorders of the skin and subcutaneous tissue E11.622 Type 2 diabetes mellitus with other skin ulcer Facility Procedures CPT4 Code: 38882800 Description: 34917 - WOUND CARE VISIT-LEV 2 EST PT Modifier: Quantity: 1 Physician Procedures CPT4 Code: 9150569 Description: 79480 - WC PHYS  LEVEL 4 - NEW PT Modifier: Quantity: 1 CPT4 Code: Description: ICD-10 Diagnosis Description L13.1 Subcorneal pustular dermatitis L98.8 Other specified disorders of the skin and subcutaneous tissue E11.622 Type 2 diabetes mellitus with other skin ulcer Modifier: Quantity: Electronic Signature(s) Signed: 04/10/2021 4:43:27 PM By: Worthy Keeler PA-C Entered By: Worthy Keeler on 04/10/2021 16:43:26

## 2021-04-15 NOTE — Progress Notes (Signed)
Mccoy, Erin M. ([id number]) Visit Report for 04/10/2021 Abuse/Suicide Risk Screen Details Patient Name: Erin Mccoy, Erin Mccoy. Date of Service: 04/10/2021 8:45 AM Medical Record Number: [id number] Patient Account Number: [id number] Date of Birth/Sex: [birth year] (70 y.o. F) Treating RN: Carlene Coria Primary Care Krysteena Stalker: Frazier Richards Other Clinician: Referring Leiann Sporer: Frazier Richards Treating Jerret Mcbane/Extender: Skipper Cliche in Treatment: 0 Abuse/Suicide Risk Screen Items Answer ABUSE RISK SCREEN: Has anyone close to you tried to hurt or harm you recentlyo No Do you feel uncomfortable with anyone in your familyo No Has anyone forced you do things that you didnot want to doo No Electronic Signature(s) Signed: 04/14/2021 6:56:11 PM By: Carlene Coria RN Entered By: Carlene Coria on 04/10/2021 09:16:29 Rodriges, Dilpreet M. ([id number]) -------------------------------------------------------------------------------- Activities of Daily Living Details Patient Name: Erin, Mccoy. Date of Service: 04/10/2021 8:45 AM Medical Record Number: [id number] Patient Account Number: [id number] Date of Birth/Sex: 1950-08-07 (70 y.o. F) Treating RN: Carlene Coria Primary Care Kadan Millstein: Frazier Richards Other Clinician: Referring Eliberto Sole: Frazier Richards Treating Genny Caulder/Extender: Skipper Cliche in Treatment: 0 Activities of Daily Living Items Answer Activities of Daily Living (Please select one for each item) Drive Automobile Not Able Take Medications Completely Able Use Telephone Completely Able Care for Appearance Completely Able Use Toilet Completely Able Bath / Shower Completely Able Dress Self Completely Able Feed Self Completely Able Walk Completely Able Get In / Out Bed Completely Able Housework Completely Able Prepare Meals Completely Able Handle Money Completely Able Shop for Self Completely Able Electronic Signature(s) Signed: 04/14/2021 6:56:11 PM By:  Carlene Coria RN Entered By: Carlene Coria on 04/10/2021 09:17:01 Caputi, Skarlet M. ([id number]) -------------------------------------------------------------------------------- Education Screening Details Patient Name: Erin, Mccoy. Date of Service: 04/10/2021 8:45 AM Medical Record Number: [id number] Patient Account Number: [id number] Date of Birth/Sex: [birth year] (70 y.o. F) Treating RN: Carlene Coria Primary Care Eleftheria Taborn: Frazier Richards Other Clinician: Referring Ieasha Boerema: Frazier Richards Treating Camdyn Beske/Extender: Skipper Cliche in Treatment: 0 Learning Preferences/Education Level/Primary Language Learning Preference: Explanation Highest Education Level: College or Above Preferred Language: English Cognitive Barrier Language Barrier: No Translator Needed: No Memory Deficit: No Emotional Barrier: No Cultural/Religious Beliefs Affecting Medical Care: No Physical Barrier Impaired Vision: No Impaired Hearing: No Decreased Hand dexterity: No Knowledge/Comprehension Knowledge Level: Medium Comprehension Level: High Ability to understand written instructions: High Ability to understand verbal instructions: High Motivation Anxiety Level: Anxious Cooperation: Cooperative Education Importance: Acknowledges Need Interest in Health Problems: Asks Questions Perception: Coherent Willingness to Engage in Self-Management High Activities: Readiness to Engage in Self-Management High Activities: Electronic Signature(s) Signed: 04/14/2021 6:56:11 PM By: Carlene Coria RN Entered By: Carlene Coria on 04/10/2021 09:17:30 Hegstrom, Ayriana M. ([id number]) -------------------------------------------------------------------------------- Fall Risk Assessment Details Patient Name: Mccoy, Erin. Date of Service: 04/10/2021 8:45 AM Medical Record Number: [id number] Patient Account Number: [id number] Date of Birth/Sex: 1951/04/20 (70 y.o. F) Treating RN: Carlene Coria Primary Care Stephens Shreve: Frazier Richards Other Clinician: Referring Zhanae Proffit: Frazier Richards Treating Roseanne Juenger/Extender: Skipper Cliche in Treatment: 0 Fall Risk Assessment Items Have you had 2 or more falls in the last 12 monthso 0 No Have you had any fall that resulted in injury in the last 12 monthso 0 No FALLS RISK SCREEN History of falling - immediate or within 3 months 0 No Secondary diagnosis (Do you have 2 or more medical diagnoseso) 0 No Ambulatory aid None/bed rest/wheelchair/nurse 0 No Crutches/cane/[place] 0 No Furniture 0 No Intravenous therapy Access/Saline/Heparin Lock 0 No Gait/Transferring Normal/ bed rest/ wheelchair 0 No Weak (short steps with or without shuffle, stooped  but able to lift head while walking, may 0 No seek support from furniture) Impaired (short steps with shuffle, may have difficulty arising from chair, head down, impaired 0 No balance) Mental Status Oriented to own ability 0 No Electronic Signature(s) Signed: 04/14/2021 6:56:11 PM By: Carlene Coria RN Entered By: Carlene Coria on 04/10/2021 09:17:42 Mancuso, Mckenzy M. ([id number]) -------------------------------------------------------------------------------- Foot Assessment Details Patient Name: Mccoy, Erin. Date of Service: 04/10/2021 8:45 AM Medical Record Number: [id number] Patient Account Number: [id number] Date of Birth/Sex: 06-26-1950 (70 y.o. F) Treating RN: Carlene Coria Primary Care Brihanna Devenport: Frazier Richards Other Clinician: Referring Flonnie Wierman: Frazier Richards Treating Kathyjo Briere/Extender: Skipper Cliche in Treatment: 0 Foot Assessment Items Site Locations + = Sensation present, - = Sensation absent, C = Callus, U = Ulcer R = Redness, W = Warmth, M = Maceration, PU = Pre-ulcerative lesion F = Fissure, S = Swelling, D = Dryness Assessment Right: Left: Other Deformity: No No Prior Foot Ulcer: No No Prior Amputation: No No Charcot Joint: No  No Ambulatory Status: Ambulatory Without Help Gait: Steady Electronic Signature(s) Signed: 04/14/2021 6:56:11 PM By: Carlene Coria RN Entered By: Carlene Coria on 04/10/2021 09:18:06 Mezera, Shanzay M. ([id number]) -------------------------------------------------------------------------------- Nutrition Risk Screening Details Patient Name: JOYIA, RIEHLE. Date of Service: 04/10/2021 8:45 AM Medical Record Number: [id number] Patient Account Number: [id number] Date of Birth/Sex: 18-Feb-1951 (70 y.o. F) Treating RN: Carlene Coria Primary Care Armenta Erskin: Frazier Richards Other Clinician: Referring Tocara Mennen: Frazier Richards Treating Alynna Hargrove/Extender: Skipper Cliche in Treatment: 0 Height (in): 58 Weight (lbs): 101 Body Mass Index (BMI): 21.1 Nutrition Risk Screening Items Score Screening NUTRITION RISK SCREEN: I have an illness or condition that made me change the kind and/or amount of food I eat 0 No I eat fewer than two meals per day 0 No I eat few fruits and vegetables, or milk products 0 No I have three or more drinks of beer, liquor or wine almost every day 0 No I have tooth or mouth problems that make it hard for me to eat 0 No I don't always have enough money to buy the food I need 0 No I eat alone most of the time 0 No I take three or more different prescribed or over-the-counter drugs a day 1 Yes Without wanting to, I have lost or gained 10 pounds in the last six months 0 No I am not always physically able to shop, cook and/or feed myself 0 No Nutrition Protocols Good Risk Protocol 0 No interventions needed Moderate Risk Protocol High Risk Proctocol Risk Level: Good Risk Score: 1 Electronic Signature(s) Signed: 04/14/2021 6:56:11 PM By: Carlene Coria RN Entered By: Carlene Coria on 04/10/2021 09:17:53

## 2021-04-22 DIAGNOSIS — T22212A Burn of second degree of left forearm, initial encounter: Secondary | ICD-10-CM | POA: Diagnosis not present

## 2021-04-22 DIAGNOSIS — R21 Rash and other nonspecific skin eruption: Secondary | ICD-10-CM | POA: Diagnosis not present

## 2021-07-28 DIAGNOSIS — E1122 Type 2 diabetes mellitus with diabetic chronic kidney disease: Secondary | ICD-10-CM | POA: Diagnosis not present

## 2021-07-28 DIAGNOSIS — I429 Cardiomyopathy, unspecified: Secondary | ICD-10-CM | POA: Diagnosis not present

## 2021-07-28 DIAGNOSIS — I10 Essential (primary) hypertension: Secondary | ICD-10-CM | POA: Diagnosis not present

## 2021-07-28 DIAGNOSIS — N183 Chronic kidney disease, stage 3 unspecified: Secondary | ICD-10-CM | POA: Diagnosis not present

## 2021-07-28 DIAGNOSIS — E038 Other specified hypothyroidism: Secondary | ICD-10-CM | POA: Diagnosis not present

## 2021-07-28 DIAGNOSIS — I5022 Chronic systolic (congestive) heart failure: Secondary | ICD-10-CM | POA: Diagnosis not present

## 2021-08-04 DIAGNOSIS — I2510 Atherosclerotic heart disease of native coronary artery without angina pectoris: Secondary | ICD-10-CM | POA: Diagnosis not present

## 2021-08-04 DIAGNOSIS — I5022 Chronic systolic (congestive) heart failure: Secondary | ICD-10-CM | POA: Diagnosis not present

## 2021-08-04 DIAGNOSIS — C439 Malignant melanoma of skin, unspecified: Secondary | ICD-10-CM | POA: Diagnosis not present

## 2021-08-04 DIAGNOSIS — E1169 Type 2 diabetes mellitus with other specified complication: Secondary | ICD-10-CM | POA: Diagnosis not present

## 2021-08-04 DIAGNOSIS — E785 Hyperlipidemia, unspecified: Secondary | ICD-10-CM | POA: Diagnosis not present

## 2021-08-04 DIAGNOSIS — D510 Vitamin B12 deficiency anemia due to intrinsic factor deficiency: Secondary | ICD-10-CM | POA: Diagnosis not present

## 2021-08-04 DIAGNOSIS — E038 Other specified hypothyroidism: Secondary | ICD-10-CM | POA: Diagnosis not present

## 2021-08-04 DIAGNOSIS — I10 Essential (primary) hypertension: Secondary | ICD-10-CM | POA: Diagnosis not present

## 2021-08-04 DIAGNOSIS — N183 Chronic kidney disease, stage 3 unspecified: Secondary | ICD-10-CM | POA: Diagnosis not present

## 2021-08-04 DIAGNOSIS — E1122 Type 2 diabetes mellitus with diabetic chronic kidney disease: Secondary | ICD-10-CM | POA: Diagnosis not present

## 2021-08-05 DIAGNOSIS — I10 Essential (primary) hypertension: Secondary | ICD-10-CM | POA: Diagnosis not present

## 2021-08-05 DIAGNOSIS — I2510 Atherosclerotic heart disease of native coronary artery without angina pectoris: Secondary | ICD-10-CM | POA: Diagnosis not present

## 2021-08-05 DIAGNOSIS — I429 Cardiomyopathy, unspecified: Secondary | ICD-10-CM | POA: Diagnosis not present

## 2021-08-05 DIAGNOSIS — E785 Hyperlipidemia, unspecified: Secondary | ICD-10-CM | POA: Diagnosis not present

## 2021-08-05 DIAGNOSIS — I5032 Chronic diastolic (congestive) heart failure: Secondary | ICD-10-CM | POA: Diagnosis not present

## 2021-08-05 DIAGNOSIS — E1169 Type 2 diabetes mellitus with other specified complication: Secondary | ICD-10-CM | POA: Diagnosis not present

## 2021-08-06 DIAGNOSIS — H401134 Primary open-angle glaucoma, bilateral, indeterminate stage: Secondary | ICD-10-CM | POA: Diagnosis not present

## 2021-09-04 DIAGNOSIS — L988 Other specified disorders of the skin and subcutaneous tissue: Secondary | ICD-10-CM | POA: Diagnosis not present

## 2021-09-04 DIAGNOSIS — L578 Other skin changes due to chronic exposure to nonionizing radiation: Secondary | ICD-10-CM | POA: Diagnosis not present

## 2021-09-04 DIAGNOSIS — D2270 Melanocytic nevi of unspecified lower limb, including hip: Secondary | ICD-10-CM | POA: Diagnosis not present

## 2021-09-04 DIAGNOSIS — L570 Actinic keratosis: Secondary | ICD-10-CM | POA: Diagnosis not present

## 2021-09-04 DIAGNOSIS — D2260 Melanocytic nevi of unspecified upper limb, including shoulder: Secondary | ICD-10-CM | POA: Diagnosis not present

## 2021-09-04 DIAGNOSIS — L814 Other melanin hyperpigmentation: Secondary | ICD-10-CM | POA: Diagnosis not present

## 2021-09-04 DIAGNOSIS — Z1283 Encounter for screening for malignant neoplasm of skin: Secondary | ICD-10-CM | POA: Diagnosis not present

## 2021-09-04 DIAGNOSIS — D1801 Hemangioma of skin and subcutaneous tissue: Secondary | ICD-10-CM | POA: Diagnosis not present

## 2021-09-04 DIAGNOSIS — D225 Melanocytic nevi of trunk: Secondary | ICD-10-CM | POA: Diagnosis not present

## 2021-09-04 DIAGNOSIS — L821 Other seborrheic keratosis: Secondary | ICD-10-CM | POA: Diagnosis not present

## 2021-09-04 DIAGNOSIS — Z85828 Personal history of other malignant neoplasm of skin: Secondary | ICD-10-CM | POA: Diagnosis not present

## 2021-09-10 DIAGNOSIS — I5022 Chronic systolic (congestive) heart failure: Secondary | ICD-10-CM | POA: Diagnosis not present

## 2021-09-10 DIAGNOSIS — E039 Hypothyroidism, unspecified: Secondary | ICD-10-CM | POA: Diagnosis not present

## 2021-09-10 DIAGNOSIS — I429 Cardiomyopathy, unspecified: Secondary | ICD-10-CM | POA: Diagnosis not present

## 2021-09-10 DIAGNOSIS — E1169 Type 2 diabetes mellitus with other specified complication: Secondary | ICD-10-CM | POA: Diagnosis not present

## 2021-09-10 DIAGNOSIS — I2510 Atherosclerotic heart disease of native coronary artery without angina pectoris: Secondary | ICD-10-CM | POA: Diagnosis not present

## 2021-09-10 DIAGNOSIS — E1122 Type 2 diabetes mellitus with diabetic chronic kidney disease: Secondary | ICD-10-CM | POA: Diagnosis not present

## 2021-09-10 DIAGNOSIS — N183 Chronic kidney disease, stage 3 unspecified: Secondary | ICD-10-CM | POA: Diagnosis not present

## 2021-09-10 DIAGNOSIS — E785 Hyperlipidemia, unspecified: Secondary | ICD-10-CM | POA: Diagnosis not present

## 2021-12-01 DIAGNOSIS — E1169 Type 2 diabetes mellitus with other specified complication: Secondary | ICD-10-CM | POA: Diagnosis not present

## 2021-12-01 DIAGNOSIS — E785 Hyperlipidemia, unspecified: Secondary | ICD-10-CM | POA: Diagnosis not present

## 2021-12-01 DIAGNOSIS — N183 Chronic kidney disease, stage 3 unspecified: Secondary | ICD-10-CM | POA: Diagnosis not present

## 2021-12-01 DIAGNOSIS — I5022 Chronic systolic (congestive) heart failure: Secondary | ICD-10-CM | POA: Diagnosis not present

## 2021-12-01 DIAGNOSIS — E1122 Type 2 diabetes mellitus with diabetic chronic kidney disease: Secondary | ICD-10-CM | POA: Diagnosis not present

## 2021-12-08 DIAGNOSIS — I429 Cardiomyopathy, unspecified: Secondary | ICD-10-CM | POA: Diagnosis not present

## 2021-12-08 DIAGNOSIS — E038 Other specified hypothyroidism: Secondary | ICD-10-CM | POA: Diagnosis not present

## 2021-12-08 DIAGNOSIS — D510 Vitamin B12 deficiency anemia due to intrinsic factor deficiency: Secondary | ICD-10-CM | POA: Diagnosis not present

## 2021-12-08 DIAGNOSIS — I10 Essential (primary) hypertension: Secondary | ICD-10-CM | POA: Diagnosis not present

## 2021-12-08 DIAGNOSIS — I5032 Chronic diastolic (congestive) heart failure: Secondary | ICD-10-CM | POA: Diagnosis not present

## 2021-12-08 DIAGNOSIS — E1122 Type 2 diabetes mellitus with diabetic chronic kidney disease: Secondary | ICD-10-CM | POA: Diagnosis not present

## 2021-12-08 DIAGNOSIS — N183 Chronic kidney disease, stage 3 unspecified: Secondary | ICD-10-CM | POA: Diagnosis not present

## 2021-12-08 DIAGNOSIS — I2510 Atherosclerotic heart disease of native coronary artery without angina pectoris: Secondary | ICD-10-CM | POA: Diagnosis not present

## 2021-12-17 ENCOUNTER — Other Ambulatory Visit: Payer: Self-pay | Admitting: Internal Medicine

## 2021-12-17 DIAGNOSIS — Z1231 Encounter for screening mammogram for malignant neoplasm of breast: Secondary | ICD-10-CM

## 2022-01-14 ENCOUNTER — Ambulatory Visit
Admission: RE | Admit: 2022-01-14 | Discharge: 2022-01-14 | Disposition: A | Discharge status: Home / Self Care | Payer: PPO | Source: Ambulatory Visit | Attending: Internal Medicine | Admitting: Internal Medicine

## 2022-01-14 DIAGNOSIS — Z1231 Encounter for screening mammogram for malignant neoplasm of breast: Secondary | ICD-10-CM | POA: Diagnosis not present

## 2022-02-04 DIAGNOSIS — E1122 Type 2 diabetes mellitus with diabetic chronic kidney disease: Secondary | ICD-10-CM | POA: Diagnosis not present

## 2022-02-04 DIAGNOSIS — E1169 Type 2 diabetes mellitus with other specified complication: Secondary | ICD-10-CM | POA: Diagnosis not present

## 2022-02-04 DIAGNOSIS — I429 Cardiomyopathy, unspecified: Secondary | ICD-10-CM | POA: Diagnosis not present

## 2022-02-04 DIAGNOSIS — N183 Chronic kidney disease, stage 3 unspecified: Secondary | ICD-10-CM | POA: Diagnosis not present

## 2022-02-04 DIAGNOSIS — I5032 Chronic diastolic (congestive) heart failure: Secondary | ICD-10-CM | POA: Diagnosis not present

## 2022-02-04 DIAGNOSIS — E785 Hyperlipidemia, unspecified: Secondary | ICD-10-CM | POA: Diagnosis not present

## 2022-02-04 DIAGNOSIS — Z9889 Other specified postprocedural states: Secondary | ICD-10-CM | POA: Diagnosis not present

## 2022-02-04 DIAGNOSIS — I10 Essential (primary) hypertension: Secondary | ICD-10-CM | POA: Diagnosis not present

## 2022-02-11 DIAGNOSIS — H401134 Primary open-angle glaucoma, bilateral, indeterminate stage: Secondary | ICD-10-CM | POA: Diagnosis not present

## 2022-03-04 DIAGNOSIS — I429 Cardiomyopathy, unspecified: Secondary | ICD-10-CM | POA: Diagnosis not present

## 2022-03-04 DIAGNOSIS — D510 Vitamin B12 deficiency anemia due to intrinsic factor deficiency: Secondary | ICD-10-CM | POA: Diagnosis not present

## 2022-03-04 DIAGNOSIS — I5032 Chronic diastolic (congestive) heart failure: Secondary | ICD-10-CM | POA: Diagnosis not present

## 2022-03-04 DIAGNOSIS — I2510 Atherosclerotic heart disease of native coronary artery without angina pectoris: Secondary | ICD-10-CM | POA: Diagnosis not present

## 2022-03-04 DIAGNOSIS — I10 Essential (primary) hypertension: Secondary | ICD-10-CM | POA: Diagnosis not present

## 2022-03-04 DIAGNOSIS — E1122 Type 2 diabetes mellitus with diabetic chronic kidney disease: Secondary | ICD-10-CM | POA: Diagnosis not present

## 2022-03-04 DIAGNOSIS — E038 Other specified hypothyroidism: Secondary | ICD-10-CM | POA: Diagnosis not present

## 2022-03-04 DIAGNOSIS — N183 Chronic kidney disease, stage 3 unspecified: Secondary | ICD-10-CM | POA: Diagnosis not present

## 2022-03-19 DIAGNOSIS — D2260 Melanocytic nevi of unspecified upper limb, including shoulder: Secondary | ICD-10-CM | POA: Diagnosis not present

## 2022-03-19 DIAGNOSIS — L304 Erythema intertrigo: Secondary | ICD-10-CM | POA: Diagnosis not present

## 2022-03-19 DIAGNOSIS — D2270 Melanocytic nevi of unspecified lower limb, including hip: Secondary | ICD-10-CM | POA: Diagnosis not present

## 2022-03-19 DIAGNOSIS — D225 Melanocytic nevi of trunk: Secondary | ICD-10-CM | POA: Diagnosis not present

## 2022-03-19 DIAGNOSIS — L814 Other melanin hyperpigmentation: Secondary | ICD-10-CM | POA: Diagnosis not present

## 2022-03-19 DIAGNOSIS — Z1283 Encounter for screening for malignant neoplasm of skin: Secondary | ICD-10-CM | POA: Diagnosis not present

## 2022-03-19 DIAGNOSIS — D1801 Hemangioma of skin and subcutaneous tissue: Secondary | ICD-10-CM | POA: Diagnosis not present

## 2022-03-19 DIAGNOSIS — Z85828 Personal history of other malignant neoplasm of skin: Secondary | ICD-10-CM | POA: Diagnosis not present

## 2022-03-19 DIAGNOSIS — L578 Other skin changes due to chronic exposure to nonionizing radiation: Secondary | ICD-10-CM | POA: Diagnosis not present

## 2022-03-19 DIAGNOSIS — L988 Other specified disorders of the skin and subcutaneous tissue: Secondary | ICD-10-CM | POA: Diagnosis not present

## 2022-03-19 DIAGNOSIS — L821 Other seborrheic keratosis: Secondary | ICD-10-CM | POA: Diagnosis not present

## 2022-04-03 DIAGNOSIS — D2372 Other benign neoplasm of skin of left lower limb, including hip: Secondary | ICD-10-CM | POA: Diagnosis not present

## 2022-04-03 DIAGNOSIS — B353 Tinea pedis: Secondary | ICD-10-CM | POA: Diagnosis not present

## 2022-04-03 DIAGNOSIS — B351 Tinea unguium: Secondary | ICD-10-CM | POA: Diagnosis not present

## 2022-04-03 DIAGNOSIS — E119 Type 2 diabetes mellitus without complications: Secondary | ICD-10-CM | POA: Diagnosis not present

## 2022-04-07 DIAGNOSIS — E1122 Type 2 diabetes mellitus with diabetic chronic kidney disease: Secondary | ICD-10-CM | POA: Diagnosis not present

## 2022-04-07 DIAGNOSIS — E038 Other specified hypothyroidism: Secondary | ICD-10-CM | POA: Diagnosis not present

## 2022-04-07 DIAGNOSIS — I2510 Atherosclerotic heart disease of native coronary artery without angina pectoris: Secondary | ICD-10-CM | POA: Diagnosis not present

## 2022-04-07 DIAGNOSIS — N183 Chronic kidney disease, stage 3 unspecified: Secondary | ICD-10-CM | POA: Diagnosis not present

## 2022-04-07 DIAGNOSIS — I429 Cardiomyopathy, unspecified: Secondary | ICD-10-CM | POA: Diagnosis not present

## 2022-04-07 DIAGNOSIS — I5032 Chronic diastolic (congestive) heart failure: Secondary | ICD-10-CM | POA: Diagnosis not present

## 2022-04-07 DIAGNOSIS — I10 Essential (primary) hypertension: Secondary | ICD-10-CM | POA: Diagnosis not present

## 2022-06-05 DIAGNOSIS — I10 Essential (primary) hypertension: Secondary | ICD-10-CM | POA: Diagnosis not present

## 2022-06-05 DIAGNOSIS — I429 Cardiomyopathy, unspecified: Secondary | ICD-10-CM | POA: Diagnosis not present

## 2022-06-05 DIAGNOSIS — I5032 Chronic diastolic (congestive) heart failure: Secondary | ICD-10-CM | POA: Diagnosis not present

## 2022-06-05 DIAGNOSIS — E1122 Type 2 diabetes mellitus with diabetic chronic kidney disease: Secondary | ICD-10-CM | POA: Diagnosis not present

## 2022-06-05 DIAGNOSIS — N183 Chronic kidney disease, stage 3 unspecified: Secondary | ICD-10-CM | POA: Diagnosis not present

## 2022-06-12 DIAGNOSIS — I5032 Chronic diastolic (congestive) heart failure: Secondary | ICD-10-CM | POA: Diagnosis not present

## 2022-06-12 DIAGNOSIS — E1169 Type 2 diabetes mellitus with other specified complication: Secondary | ICD-10-CM | POA: Diagnosis not present

## 2022-06-12 DIAGNOSIS — E038 Other specified hypothyroidism: Secondary | ICD-10-CM | POA: Diagnosis not present

## 2022-06-12 DIAGNOSIS — Z0000 Encounter for general adult medical examination without abnormal findings: Secondary | ICD-10-CM | POA: Diagnosis not present

## 2022-06-12 DIAGNOSIS — E1122 Type 2 diabetes mellitus with diabetic chronic kidney disease: Secondary | ICD-10-CM | POA: Diagnosis not present

## 2022-06-12 DIAGNOSIS — E785 Hyperlipidemia, unspecified: Secondary | ICD-10-CM | POA: Diagnosis not present

## 2022-06-12 DIAGNOSIS — N183 Chronic kidney disease, stage 3 unspecified: Secondary | ICD-10-CM | POA: Diagnosis not present

## 2022-06-12 DIAGNOSIS — D510 Vitamin B12 deficiency anemia due to intrinsic factor deficiency: Secondary | ICD-10-CM | POA: Diagnosis not present

## 2022-06-12 DIAGNOSIS — E083599 Diabetes mellitus due to underlying condition with proliferative diabetic retinopathy without macular edema, unspecified eye: Secondary | ICD-10-CM | POA: Diagnosis not present

## 2022-06-30 DIAGNOSIS — I10 Essential (primary) hypertension: Secondary | ICD-10-CM | POA: Diagnosis not present

## 2022-06-30 DIAGNOSIS — E1122 Type 2 diabetes mellitus with diabetic chronic kidney disease: Secondary | ICD-10-CM | POA: Diagnosis not present

## 2022-06-30 DIAGNOSIS — I255 Ischemic cardiomyopathy: Secondary | ICD-10-CM | POA: Diagnosis not present

## 2022-06-30 DIAGNOSIS — I5032 Chronic diastolic (congestive) heart failure: Secondary | ICD-10-CM | POA: Diagnosis not present

## 2022-06-30 DIAGNOSIS — N183 Chronic kidney disease, stage 3 unspecified: Secondary | ICD-10-CM | POA: Diagnosis not present

## 2022-06-30 DIAGNOSIS — Z23 Encounter for immunization: Secondary | ICD-10-CM | POA: Diagnosis not present

## 2022-07-15 DIAGNOSIS — I255 Ischemic cardiomyopathy: Secondary | ICD-10-CM | POA: Diagnosis not present

## 2022-08-12 DIAGNOSIS — I429 Cardiomyopathy, unspecified: Secondary | ICD-10-CM | POA: Diagnosis not present

## 2022-08-26 DIAGNOSIS — Z961 Presence of intraocular lens: Secondary | ICD-10-CM | POA: Diagnosis not present

## 2022-08-26 DIAGNOSIS — H401134 Primary open-angle glaucoma, bilateral, indeterminate stage: Secondary | ICD-10-CM | POA: Diagnosis not present

## 2022-08-26 DIAGNOSIS — H26491 Other secondary cataract, right eye: Secondary | ICD-10-CM | POA: Diagnosis not present

## 2022-12-07 DIAGNOSIS — E1122 Type 2 diabetes mellitus with diabetic chronic kidney disease: Secondary | ICD-10-CM | POA: Diagnosis not present

## 2022-12-07 DIAGNOSIS — N183 Chronic kidney disease, stage 3 unspecified: Secondary | ICD-10-CM | POA: Diagnosis not present

## 2022-12-07 DIAGNOSIS — E785 Hyperlipidemia, unspecified: Secondary | ICD-10-CM | POA: Diagnosis not present

## 2022-12-07 DIAGNOSIS — E1169 Type 2 diabetes mellitus with other specified complication: Secondary | ICD-10-CM | POA: Diagnosis not present

## 2022-12-07 DIAGNOSIS — I5032 Chronic diastolic (congestive) heart failure: Secondary | ICD-10-CM | POA: Diagnosis not present

## 2022-12-14 DIAGNOSIS — E083599 Diabetes mellitus due to underlying condition with proliferative diabetic retinopathy without macular edema, unspecified eye: Secondary | ICD-10-CM | POA: Diagnosis not present

## 2022-12-14 DIAGNOSIS — E785 Hyperlipidemia, unspecified: Secondary | ICD-10-CM | POA: Diagnosis not present

## 2022-12-14 DIAGNOSIS — E1169 Type 2 diabetes mellitus with other specified complication: Secondary | ICD-10-CM | POA: Diagnosis not present

## 2022-12-14 DIAGNOSIS — I429 Cardiomyopathy, unspecified: Secondary | ICD-10-CM | POA: Diagnosis not present

## 2022-12-14 DIAGNOSIS — N183 Chronic kidney disease, stage 3 unspecified: Secondary | ICD-10-CM | POA: Diagnosis not present

## 2022-12-14 DIAGNOSIS — E038 Other specified hypothyroidism: Secondary | ICD-10-CM | POA: Diagnosis not present

## 2022-12-14 DIAGNOSIS — E1122 Type 2 diabetes mellitus with diabetic chronic kidney disease: Secondary | ICD-10-CM | POA: Diagnosis not present

## 2022-12-14 DIAGNOSIS — C439 Malignant melanoma of skin, unspecified: Secondary | ICD-10-CM | POA: Diagnosis not present

## 2022-12-14 DIAGNOSIS — D510 Vitamin B12 deficiency anemia due to intrinsic factor deficiency: Secondary | ICD-10-CM | POA: Diagnosis not present

## 2022-12-24 ENCOUNTER — Other Ambulatory Visit: Payer: Self-pay | Admitting: Internal Medicine

## 2022-12-24 DIAGNOSIS — Z1231 Encounter for screening mammogram for malignant neoplasm of breast: Secondary | ICD-10-CM

## 2023-01-20 DIAGNOSIS — L988 Other specified disorders of the skin and subcutaneous tissue: Secondary | ICD-10-CM | POA: Diagnosis not present

## 2023-02-02 ENCOUNTER — Ambulatory Visit
Admission: RE | Admit: 2023-02-02 | Discharge: 2023-02-02 | Disposition: A | Discharge status: Home / Self Care | Payer: PPO | Source: Ambulatory Visit | Attending: Internal Medicine | Admitting: Internal Medicine

## 2023-02-02 DIAGNOSIS — Z1231 Encounter for screening mammogram for malignant neoplasm of breast: Secondary | ICD-10-CM | POA: Insufficient documentation

## 2023-03-01 DIAGNOSIS — E113593 Type 2 diabetes mellitus with proliferative diabetic retinopathy without macular edema, bilateral: Secondary | ICD-10-CM | POA: Diagnosis not present

## 2023-03-01 DIAGNOSIS — H401134 Primary open-angle glaucoma, bilateral, indeterminate stage: Secondary | ICD-10-CM | POA: Diagnosis not present

## 2023-03-01 DIAGNOSIS — H26491 Other secondary cataract, right eye: Secondary | ICD-10-CM | POA: Diagnosis not present

## 2023-03-01 DIAGNOSIS — Z961 Presence of intraocular lens: Secondary | ICD-10-CM | POA: Diagnosis not present

## 2023-03-15 DIAGNOSIS — D229 Melanocytic nevi, unspecified: Secondary | ICD-10-CM | POA: Diagnosis not present

## 2023-03-15 DIAGNOSIS — L814 Other melanin hyperpigmentation: Secondary | ICD-10-CM | POA: Diagnosis not present

## 2023-03-15 DIAGNOSIS — L988 Other specified disorders of the skin and subcutaneous tissue: Secondary | ICD-10-CM | POA: Diagnosis not present

## 2023-03-15 DIAGNOSIS — B353 Tinea pedis: Secondary | ICD-10-CM | POA: Diagnosis not present

## 2023-03-15 DIAGNOSIS — Z86006 Personal history of melanoma in-situ: Secondary | ICD-10-CM | POA: Diagnosis not present

## 2023-03-15 DIAGNOSIS — Z85828 Personal history of other malignant neoplasm of skin: Secondary | ICD-10-CM | POA: Diagnosis not present

## 2023-04-25 ENCOUNTER — Ambulatory Visit
Admission: EM | Admit: 2023-04-25 | Discharge: 2023-04-25 | Disposition: A | Discharge status: Home / Self Care | Payer: PPO

## 2023-04-25 DIAGNOSIS — R040 Epistaxis: Secondary | ICD-10-CM

## 2023-04-25 NOTE — Discharge Instructions (Signed)
May use this all amount of antibiotic ointment on a Q-tip to help keep nasal passages moist.  May use Afrin as label directed for nosebleed.  If nosebleed persists or you have worsening symptoms unable to stop the bleeding after 15 minutes go to the emergency room for further evaluation.  No nasal packing or cautery required at present.  May use humidifier for symptom management as well

## 2023-04-25 NOTE — ED Triage Notes (Signed)
PT states that she only bled from the left nostril and denies any pain. Pt states that it started upon standing up and she was not picking or scratching at her nose. Pt states that she can smell fine and denies any facial swelling or left side head pain.

## 2023-04-25 NOTE — ED Triage Notes (Signed)
Pt is with her sister  Pt c/o nose bleed starting at 5:20am today  Pt states that the bleeding comes and goes and has been occurring with movement  Pt has been using OTC Xyzal and Nasal spray - premium saline- earlier this week.

## 2023-04-25 NOTE — ED Provider Notes (Signed)
[facility]    CSN: [id number] Arrival date & time: 04/25/23  7829      History   Chief Complaint Chief Complaint  Patient presents with   Epistaxis    HPI Erin Mccoy is a 72 y.o. female.   72 year old, Erin Mccoy, presents to urgent care for evaluation of left-sided nosebleed that started approximately 0520 this am this morning.  Patient states that the bleeding has stopped just wanted to get checked out.  Denies any trauma or injury.  Patient has been using allergy medicine and saline nasal spray.  The history is provided by the patient. No language interpreter was used.    Past Medical History:  Diagnosis Date   Allergy    Arthritis    Cancer (HCC)    skin ca on scalp   CHF (congestive heart failure) (HCC)    Chronic kidney disease    Coronary artery disease    Diabetes mellitus without complication (HCC)    Glaucoma    H/O echocardiogram    Hyperlipidemia    Hypertension    Ischemic cardiomyopathy    Myocardial infarction (HCC)    Sepsis [facility])     Patient Active Problem List   Diagnosis Date Noted   Left-sided epistaxis 04/25/2023   Basal cell carcinoma, scalp/neck 06/24/2016    Past Surgical History:  Procedure Laterality Date   APPENDECTOMY     BREAST BIOPSY Left    neg   CARDIAC CATHETERIZATION  2015   CARPAL TUNNEL RELEASE     CATARACT EXTRACTION Left    CATARACT EXTRACTION W/PHACO Right 11/04/2016   Procedure: CATARACT EXTRACTION PHACO AND INTRAOCULAR LENS PLACEMENT (IOC) Complicated Right diabetic toric lens;  Surgeon: Lockie Mola, MD;  Location: [facility];  Service: Ophthalmology;  Laterality: Right;  diabetic toric lens  malyugin   COLONOSCOPY  2014   MOHS SURGERY     scalp   TONSILLECTOMY      OB History   No obstetric history on file.      Home Medications    Prior to Admission medications   Medication Sig Start Date End Date Taking? Authorizing Provider  aspirin EC 81 MG tablet Take  81 mg by mouth daily.   Yes [provider]  bimatoprost (LUMIGAN) 0.01 % SOLN Apply to eye.   Yes [provider]  COMBIGAN 0.2-0.5 % ophthalmic solution Place 0.2-0.5 drops into both eyes 2 (two) times daily. 05/04/16  Yes [provider]  levothyroxine (SYNTHROID) 50 MCG tablet Take 50 mcg by mouth daily.   Yes [provider]  metFORMIN (GLUCOPHAGE) 1000 MG tablet Take 1,000 mg by mouth 2 (two) times daily. 05/27/16  Yes [provider]  metoprolol succinate (TOPROL-XL) 25 MG 24 hr tablet Take 25 mg by mouth daily. 10/16/15  Yes [provider]  pravastatin (PRAVACHOL) 40 MG tablet Take 40 mg by mouth daily. 06/12/16  Yes [provider]  Travoprost, BAK Free, (TRAVATAN Z) 0.004 % SOLN ophthalmic solution Place 1 drop into both eyes at bedtime. 01/18/14  Yes [provider]  glimepiride (AMARYL) 1 MG tablet Take 1 mg by mouth daily. 03/30/16   [provider]  lisinopril (PRINIVIL,ZESTRIL) 10 MG tablet Take 10 mg by mouth daily. 08/27/15   [provider]  torsemide (DEMADEX) 10 MG tablet Take 10 mg by mouth 2 (two) times daily. 06/26/16   [provider]    Family History Family History  Problem Relation Age of Onset  Breast cancer Mother    Breast cancer Maternal Aunt    Breast cancer Cousin        mat cousin    Social History Social History   Tobacco Use   Smoking status: Never   Smokeless tobacco: Never  Vaping Use   Vaping status: Never Used  Substance Use Topics   Alcohol use: No   Drug use: Never     Allergies   Patient has no known allergies.   Review of Systems Review of Systems  Constitutional:  Negative for fever.  HENT:  Positive for nosebleeds.   All other systems reviewed and are negative.    Physical Exam Triage Vital Signs ED Triage Vitals  Encounter Vitals Group     BP 04/25/23 0913 96/79     Systolic BP Percentile --      Diastolic BP Percentile --       Pulse Rate 04/25/23 0913 72     Resp --      Temp 04/25/23 0913 98 F (36.7 C)     Temp Source 04/25/23 0913 Oral     SpO2 04/25/23 0913 100 %     Weight 04/25/23 0909 105 lb (47.6 kg)     Height 04/25/23 0909 4\' 9"  (1.448 m)     Head Circumference --      Peak Flow --      Pain Score 04/25/23 0909 0     Pain Loc --      Pain Education --      Exclude from Growth Chart --    No data found.  Updated Vital Signs BP 96/79 (BP Location: Left Arm)   Pulse 72   Temp 98 F (36.7 C) (Oral)   Ht 4\' 9"  (1.448 m)   Wt 105 lb (47.6 kg)   SpO2 100%   BMI 22.72 kg/m   Visual Acuity Right Eye Distance:   Left Eye Distance:   Bilateral Distance:    Right Eye Near:   Left Eye Near:    Bilateral Near:     Physical Exam Vitals and nursing note reviewed.  Constitutional:      General: She is not in acute distress.    Appearance: She is well-developed and well-groomed.  HENT:     Head: Normocephalic and atraumatic.     Right Ear: Tympanic membrane is retracted.     Left Ear: Tympanic membrane is retracted.     Nose: Mucosal edema and congestion present.     Left Nostril: Epistaxis present.     Mouth/Throat:     Lips: Pink.     Mouth: Mucous membranes are moist.     Pharynx: Oropharynx is clear. Uvula midline.  Eyes:     Conjunctiva/sclera: Conjunctivae normal.  Cardiovascular:     Rate and Rhythm: Normal rate and regular rhythm.     Pulses: Normal pulses.     Heart sounds: Normal heart sounds. No murmur heard. Pulmonary:     Effort: Pulmonary effort is normal. No respiratory distress.     Breath sounds: Normal breath sounds and air entry.  Abdominal:     Palpations: Abdomen is soft.     Tenderness: There is no abdominal tenderness.  Musculoskeletal:        General: No swelling.     Cervical back: Neck supple.  Skin:    General: Skin is warm and dry.     Capillary Refill: Capillary refill takes less than 2 seconds.  Neurological:  General: No focal deficit  present.     Mental Status: She is alert and oriented to person, place, and time.     GCS: GCS eye subscore is 4. GCS verbal subscore is 5. GCS motor subscore is 6.     Cranial Nerves: No cranial nerve deficit.     Sensory: No sensory deficit.  Psychiatric:        Attention and Perception: Attention normal.        Mood and Affect: Mood normal.        Speech: Speech normal.        Behavior: Behavior normal. Behavior is cooperative.      UC Treatments / Results  Labs (all labs ordered are listed, but only abnormal results are displayed) Labs Reviewed - No data to display  EKG   Radiology No results found.  Procedures Procedures (including critical care time)  Medications Ordered in UC Medications - No data to display  Initial Impression / Assessment and Plan / UC Course  I have reviewed the triage vital signs and the nursing notes.  Pertinent labs & imaging results that were available during my care of the patient were reviewed by me and considered in my medical decision making (see chart for details).    Discussed plan of care with patient, patient family both verbalized understanding to this provider, referral to ENT given.  Strict go to ER precautions given  Ddx: Left-sided epistaxis, allergy medicine Final Clinical Impressions(s) / UC Diagnoses   Final diagnoses:  Left-sided epistaxis     Discharge Instructions      May use this all amount of antibiotic ointment on a Q-tip to help keep nasal passages moist.  May use Afrin as label directed for nosebleed.  If nosebleed persists or you have worsening symptoms unable to stop the bleeding after 15 minutes go to the emergency room for further evaluation.  No nasal packing or cautery required at present.  May use humidifier for symptom management as well     ED Prescriptions   None    PDMP not reviewed this encounter.   Clancy Gourd, NP 04/25/23 1000

## 2023-04-28 DIAGNOSIS — E785 Hyperlipidemia, unspecified: Secondary | ICD-10-CM | POA: Diagnosis not present

## 2023-04-28 DIAGNOSIS — I10 Essential (primary) hypertension: Secondary | ICD-10-CM | POA: Diagnosis not present

## 2023-04-28 DIAGNOSIS — E1169 Type 2 diabetes mellitus with other specified complication: Secondary | ICD-10-CM | POA: Diagnosis not present

## 2023-04-28 DIAGNOSIS — I255 Ischemic cardiomyopathy: Secondary | ICD-10-CM | POA: Diagnosis not present

## 2023-04-28 DIAGNOSIS — R231 Pallor: Secondary | ICD-10-CM | POA: Diagnosis not present

## 2023-04-28 DIAGNOSIS — I2510 Atherosclerotic heart disease of native coronary artery without angina pectoris: Secondary | ICD-10-CM | POA: Diagnosis not present

## 2023-05-17 DIAGNOSIS — L988 Other specified disorders of the skin and subcutaneous tissue: Secondary | ICD-10-CM | POA: Diagnosis not present

## 2023-05-17 DIAGNOSIS — L814 Other melanin hyperpigmentation: Secondary | ICD-10-CM | POA: Diagnosis not present

## 2023-05-17 DIAGNOSIS — D229 Melanocytic nevi, unspecified: Secondary | ICD-10-CM | POA: Diagnosis not present

## 2023-05-17 DIAGNOSIS — Z86006 Personal history of melanoma in-situ: Secondary | ICD-10-CM | POA: Diagnosis not present

## 2023-05-17 DIAGNOSIS — Z85828 Personal history of other malignant neoplasm of skin: Secondary | ICD-10-CM | POA: Diagnosis not present

## 2023-05-17 DIAGNOSIS — L308 Other specified dermatitis: Secondary | ICD-10-CM | POA: Diagnosis not present

## 2023-05-17 DIAGNOSIS — L821 Other seborrheic keratosis: Secondary | ICD-10-CM | POA: Diagnosis not present

## 2023-06-15 DIAGNOSIS — D510 Vitamin B12 deficiency anemia due to intrinsic factor deficiency: Secondary | ICD-10-CM | POA: Diagnosis not present

## 2023-06-15 DIAGNOSIS — D649 Anemia, unspecified: Secondary | ICD-10-CM | POA: Diagnosis not present

## 2023-06-15 DIAGNOSIS — N183 Chronic kidney disease, stage 3 unspecified: Secondary | ICD-10-CM | POA: Diagnosis not present

## 2023-06-15 DIAGNOSIS — E038 Other specified hypothyroidism: Secondary | ICD-10-CM | POA: Diagnosis not present

## 2023-06-15 DIAGNOSIS — E1122 Type 2 diabetes mellitus with diabetic chronic kidney disease: Secondary | ICD-10-CM | POA: Diagnosis not present

## 2023-06-28 DIAGNOSIS — C439 Malignant melanoma of skin, unspecified: Secondary | ICD-10-CM | POA: Diagnosis not present

## 2023-06-28 DIAGNOSIS — D510 Vitamin B12 deficiency anemia due to intrinsic factor deficiency: Secondary | ICD-10-CM | POA: Diagnosis not present

## 2023-06-28 DIAGNOSIS — R531 Weakness: Secondary | ICD-10-CM | POA: Diagnosis not present

## 2023-06-28 DIAGNOSIS — E1122 Type 2 diabetes mellitus with diabetic chronic kidney disease: Secondary | ICD-10-CM | POA: Diagnosis not present

## 2023-06-28 DIAGNOSIS — I429 Cardiomyopathy, unspecified: Secondary | ICD-10-CM | POA: Diagnosis not present

## 2023-06-28 DIAGNOSIS — I255 Ischemic cardiomyopathy: Secondary | ICD-10-CM | POA: Diagnosis not present

## 2023-06-28 DIAGNOSIS — Z0000 Encounter for general adult medical examination without abnormal findings: Secondary | ICD-10-CM | POA: Diagnosis not present

## 2023-06-28 DIAGNOSIS — I2510 Atherosclerotic heart disease of native coronary artery without angina pectoris: Secondary | ICD-10-CM | POA: Diagnosis not present

## 2023-06-28 DIAGNOSIS — E083599 Diabetes mellitus due to underlying condition with proliferative diabetic retinopathy without macular edema, unspecified eye: Secondary | ICD-10-CM | POA: Diagnosis not present

## 2023-06-28 DIAGNOSIS — E038 Other specified hypothyroidism: Secondary | ICD-10-CM | POA: Diagnosis not present

## 2023-06-28 DIAGNOSIS — E1169 Type 2 diabetes mellitus with other specified complication: Secondary | ICD-10-CM | POA: Diagnosis not present

## 2023-06-28 DIAGNOSIS — I10 Essential (primary) hypertension: Secondary | ICD-10-CM | POA: Diagnosis not present

## 2023-07-01 DIAGNOSIS — Z556 Problems related to health literacy: Secondary | ICD-10-CM | POA: Diagnosis not present

## 2023-07-01 DIAGNOSIS — Z7982 Long term (current) use of aspirin: Secondary | ICD-10-CM | POA: Diagnosis not present

## 2023-07-01 DIAGNOSIS — H409 Unspecified glaucoma: Secondary | ICD-10-CM | POA: Diagnosis not present

## 2023-07-01 DIAGNOSIS — E1169 Type 2 diabetes mellitus with other specified complication: Secondary | ICD-10-CM | POA: Diagnosis not present

## 2023-07-01 DIAGNOSIS — C439 Malignant melanoma of skin, unspecified: Secondary | ICD-10-CM | POA: Diagnosis not present

## 2023-07-01 DIAGNOSIS — I509 Heart failure, unspecified: Secondary | ICD-10-CM | POA: Diagnosis not present

## 2023-07-01 DIAGNOSIS — N1832 Chronic kidney disease, stage 3b: Secondary | ICD-10-CM | POA: Diagnosis not present

## 2023-07-01 DIAGNOSIS — I130 Hypertensive heart and chronic kidney disease with heart failure and stage 1 through stage 4 chronic kidney disease, or unspecified chronic kidney disease: Secondary | ICD-10-CM | POA: Diagnosis not present

## 2023-07-01 DIAGNOSIS — D510 Vitamin B12 deficiency anemia due to intrinsic factor deficiency: Secondary | ICD-10-CM | POA: Diagnosis not present

## 2023-07-01 DIAGNOSIS — K8689 Other specified diseases of pancreas: Secondary | ICD-10-CM | POA: Diagnosis not present

## 2023-07-01 DIAGNOSIS — Z7984 Long term (current) use of oral hypoglycemic drugs: Secondary | ICD-10-CM | POA: Diagnosis not present

## 2023-07-01 DIAGNOSIS — E113599 Type 2 diabetes mellitus with proliferative diabetic retinopathy without macular edema, unspecified eye: Secondary | ICD-10-CM | POA: Diagnosis not present

## 2023-07-01 DIAGNOSIS — Z604 Social exclusion and rejection: Secondary | ICD-10-CM | POA: Diagnosis not present

## 2023-07-01 DIAGNOSIS — K5289 Other specified noninfective gastroenteritis and colitis: Secondary | ICD-10-CM | POA: Diagnosis not present

## 2023-07-01 DIAGNOSIS — E1122 Type 2 diabetes mellitus with diabetic chronic kidney disease: Secondary | ICD-10-CM | POA: Diagnosis not present

## 2023-07-01 DIAGNOSIS — I2510 Atherosclerotic heart disease of native coronary artery without angina pectoris: Secondary | ICD-10-CM | POA: Diagnosis not present

## 2023-07-01 DIAGNOSIS — E02 Subclinical iodine-deficiency hypothyroidism: Secondary | ICD-10-CM | POA: Diagnosis not present

## 2023-07-01 DIAGNOSIS — E785 Hyperlipidemia, unspecified: Secondary | ICD-10-CM | POA: Diagnosis not present

## 2023-07-01 DIAGNOSIS — I10 Essential (primary) hypertension: Secondary | ICD-10-CM | POA: Diagnosis not present

## 2023-07-01 DIAGNOSIS — I255 Ischemic cardiomyopathy: Secondary | ICD-10-CM | POA: Diagnosis not present

## 2023-07-05 DIAGNOSIS — R634 Abnormal weight loss: Secondary | ICD-10-CM | POA: Diagnosis not present

## 2023-07-05 DIAGNOSIS — Z66 Do not resuscitate: Secondary | ICD-10-CM | POA: Diagnosis not present

## 2023-07-05 DIAGNOSIS — M25451 Effusion, right hip: Secondary | ICD-10-CM | POA: Diagnosis not present

## 2023-07-05 DIAGNOSIS — I129 Hypertensive chronic kidney disease with stage 1 through stage 4 chronic kidney disease, or unspecified chronic kidney disease: Secondary | ICD-10-CM | POA: Diagnosis not present

## 2023-07-05 DIAGNOSIS — Z79890 Hormone replacement therapy (postmenopausal): Secondary | ICD-10-CM | POA: Diagnosis not present

## 2023-07-05 DIAGNOSIS — E1122 Type 2 diabetes mellitus with diabetic chronic kidney disease: Secondary | ICD-10-CM | POA: Diagnosis not present

## 2023-07-05 DIAGNOSIS — K5900 Constipation, unspecified: Secondary | ICD-10-CM | POA: Diagnosis not present

## 2023-07-05 DIAGNOSIS — K8689 Other specified diseases of pancreas: Secondary | ICD-10-CM | POA: Diagnosis not present

## 2023-07-05 DIAGNOSIS — H409 Unspecified glaucoma: Secondary | ICD-10-CM | POA: Diagnosis not present

## 2023-07-05 DIAGNOSIS — Z79899 Other long term (current) drug therapy: Secondary | ICD-10-CM | POA: Diagnosis not present

## 2023-07-05 DIAGNOSIS — E785 Hyperlipidemia, unspecified: Secondary | ICD-10-CM | POA: Diagnosis not present

## 2023-07-05 DIAGNOSIS — R7989 Other specified abnormal findings of blood chemistry: Secondary | ICD-10-CM | POA: Diagnosis not present

## 2023-07-05 DIAGNOSIS — R339 Retention of urine, unspecified: Secondary | ICD-10-CM | POA: Diagnosis not present

## 2023-07-05 DIAGNOSIS — K8020 Calculus of gallbladder without cholecystitis without obstruction: Secondary | ICD-10-CM | POA: Diagnosis not present

## 2023-07-05 DIAGNOSIS — K5289 Other specified noninfective gastroenteritis and colitis: Secondary | ICD-10-CM | POA: Diagnosis not present

## 2023-07-05 DIAGNOSIS — Z85820 Personal history of malignant melanoma of skin: Secondary | ICD-10-CM | POA: Diagnosis not present

## 2023-07-05 DIAGNOSIS — Z7982 Long term (current) use of aspirin: Secondary | ICD-10-CM | POA: Diagnosis not present

## 2023-07-05 DIAGNOSIS — Z9049 Acquired absence of other specified parts of digestive tract: Secondary | ICD-10-CM | POA: Diagnosis not present

## 2023-07-05 DIAGNOSIS — E039 Hypothyroidism, unspecified: Secondary | ICD-10-CM | POA: Diagnosis not present

## 2023-07-05 DIAGNOSIS — N183 Chronic kidney disease, stage 3 unspecified: Secondary | ICD-10-CM | POA: Diagnosis not present

## 2023-07-05 DIAGNOSIS — R1032 Left lower quadrant pain: Secondary | ICD-10-CM | POA: Diagnosis not present

## 2023-07-06 DIAGNOSIS — K5289 Other specified noninfective gastroenteritis and colitis: Secondary | ICD-10-CM | POA: Diagnosis not present

## 2023-07-07 DIAGNOSIS — M7989 Other specified soft tissue disorders: Secondary | ICD-10-CM | POA: Diagnosis not present

## 2023-07-07 DIAGNOSIS — T83098A Other mechanical complication of other indwelling urethral catheter, initial encounter: Secondary | ICD-10-CM | POA: Diagnosis not present

## 2023-07-07 DIAGNOSIS — R9431 Abnormal electrocardiogram [ECG] [EKG]: Secondary | ICD-10-CM | POA: Diagnosis not present

## 2023-07-07 DIAGNOSIS — I4510 Unspecified right bundle-branch block: Secondary | ICD-10-CM | POA: Diagnosis not present

## 2023-07-07 DIAGNOSIS — Z9889 Other specified postprocedural states: Secondary | ICD-10-CM | POA: Diagnosis not present

## 2023-07-07 DIAGNOSIS — I252 Old myocardial infarction: Secondary | ICD-10-CM | POA: Diagnosis not present

## 2023-07-07 DIAGNOSIS — I130 Hypertensive heart and chronic kidney disease with heart failure and stage 1 through stage 4 chronic kidney disease, or unspecified chronic kidney disease: Secondary | ICD-10-CM | POA: Diagnosis not present

## 2023-07-07 DIAGNOSIS — T839XXA Unspecified complication of genitourinary prosthetic device, implant and graft, initial encounter: Secondary | ICD-10-CM | POA: Diagnosis not present

## 2023-07-07 DIAGNOSIS — I2510 Atherosclerotic heart disease of native coronary artery without angina pectoris: Secondary | ICD-10-CM | POA: Diagnosis not present

## 2023-07-07 DIAGNOSIS — Z950 Presence of cardiac pacemaker: Secondary | ICD-10-CM | POA: Diagnosis not present

## 2023-07-07 DIAGNOSIS — E039 Hypothyroidism, unspecified: Secondary | ICD-10-CM | POA: Diagnosis not present

## 2023-07-07 DIAGNOSIS — E119 Type 2 diabetes mellitus without complications: Secondary | ICD-10-CM | POA: Diagnosis not present

## 2023-07-07 DIAGNOSIS — I4519 Other right bundle-branch block: Secondary | ICD-10-CM | POA: Diagnosis not present

## 2023-07-07 DIAGNOSIS — I452 Bifascicular block: Secondary | ICD-10-CM | POA: Diagnosis not present

## 2023-07-07 DIAGNOSIS — M85841 Other specified disorders of bone density and structure, right hand: Secondary | ICD-10-CM | POA: Diagnosis not present

## 2023-07-07 DIAGNOSIS — I959 Hypotension, unspecified: Secondary | ICD-10-CM | POA: Diagnosis not present

## 2023-07-07 DIAGNOSIS — R7401 Elevation of levels of liver transaminase levels: Secondary | ICD-10-CM | POA: Diagnosis not present

## 2023-07-07 DIAGNOSIS — I10 Essential (primary) hypertension: Secondary | ICD-10-CM | POA: Diagnosis not present

## 2023-07-07 DIAGNOSIS — K5900 Constipation, unspecified: Secondary | ICD-10-CM | POA: Diagnosis not present

## 2023-07-07 DIAGNOSIS — N390 Urinary tract infection, site not specified: Secondary | ICD-10-CM | POA: Diagnosis not present

## 2023-07-07 DIAGNOSIS — T83091A Other mechanical complication of indwelling urethral catheter, initial encounter: Secondary | ICD-10-CM | POA: Diagnosis not present

## 2023-07-07 DIAGNOSIS — I509 Heart failure, unspecified: Secondary | ICD-10-CM | POA: Diagnosis not present

## 2023-07-07 DIAGNOSIS — Z79899 Other long term (current) drug therapy: Secondary | ICD-10-CM | POA: Diagnosis not present

## 2023-07-07 DIAGNOSIS — I441 Atrioventricular block, second degree: Secondary | ICD-10-CM | POA: Diagnosis not present

## 2023-07-07 DIAGNOSIS — R001 Bradycardia, unspecified: Secondary | ICD-10-CM | POA: Diagnosis not present

## 2023-07-07 DIAGNOSIS — R0602 Shortness of breath: Secondary | ICD-10-CM | POA: Diagnosis not present

## 2023-07-07 DIAGNOSIS — R339 Retention of urine, unspecified: Secondary | ICD-10-CM | POA: Diagnosis not present

## 2023-07-07 DIAGNOSIS — N183 Chronic kidney disease, stage 3 unspecified: Secondary | ICD-10-CM | POA: Diagnosis not present

## 2023-07-07 DIAGNOSIS — I442 Atrioventricular block, complete: Secondary | ICD-10-CM | POA: Diagnosis not present

## 2023-07-07 DIAGNOSIS — E1122 Type 2 diabetes mellitus with diabetic chronic kidney disease: Secondary | ICD-10-CM | POA: Diagnosis not present

## 2023-07-08 DIAGNOSIS — B359 Dermatophytosis, unspecified: Secondary | ICD-10-CM | POA: Diagnosis not present

## 2023-07-08 DIAGNOSIS — R531 Weakness: Secondary | ICD-10-CM | POA: Diagnosis not present

## 2023-07-08 DIAGNOSIS — E119 Type 2 diabetes mellitus without complications: Secondary | ICD-10-CM | POA: Diagnosis not present

## 2023-07-08 DIAGNOSIS — N189 Chronic kidney disease, unspecified: Secondary | ICD-10-CM | POA: Diagnosis not present

## 2023-07-08 DIAGNOSIS — Z950 Presence of cardiac pacemaker: Secondary | ICD-10-CM | POA: Diagnosis not present

## 2023-07-08 DIAGNOSIS — E569 Vitamin deficiency, unspecified: Secondary | ICD-10-CM | POA: Diagnosis not present

## 2023-07-08 DIAGNOSIS — E039 Hypothyroidism, unspecified: Secondary | ICD-10-CM | POA: Diagnosis not present

## 2023-07-08 DIAGNOSIS — N183 Chronic kidney disease, stage 3 unspecified: Secondary | ICD-10-CM | POA: Diagnosis not present

## 2023-07-08 DIAGNOSIS — M25541 Pain in joints of right hand: Secondary | ICD-10-CM | POA: Diagnosis not present

## 2023-07-08 DIAGNOSIS — M6259 Muscle wasting and atrophy, not elsewhere classified, multiple sites: Secondary | ICD-10-CM | POA: Diagnosis not present

## 2023-07-08 DIAGNOSIS — K5289 Other specified noninfective gastroenteritis and colitis: Secondary | ICD-10-CM | POA: Diagnosis not present

## 2023-07-08 DIAGNOSIS — T83511A Infection and inflammatory reaction due to indwelling urethral catheter, initial encounter: Secondary | ICD-10-CM | POA: Diagnosis not present

## 2023-07-08 DIAGNOSIS — R338 Other retention of urine: Secondary | ICD-10-CM | POA: Diagnosis not present

## 2023-07-08 DIAGNOSIS — N390 Urinary tract infection, site not specified: Secondary | ICD-10-CM | POA: Diagnosis not present

## 2023-07-08 DIAGNOSIS — E1122 Type 2 diabetes mellitus with diabetic chronic kidney disease: Secondary | ICD-10-CM | POA: Diagnosis not present

## 2023-07-08 DIAGNOSIS — I130 Hypertensive heart and chronic kidney disease with heart failure and stage 1 through stage 4 chronic kidney disease, or unspecified chronic kidney disease: Secondary | ICD-10-CM | POA: Diagnosis not present

## 2023-07-08 DIAGNOSIS — M6250 Muscle wasting and atrophy, not elsewhere classified, unspecified site: Secondary | ICD-10-CM | POA: Diagnosis not present

## 2023-07-08 DIAGNOSIS — Z85828 Personal history of other malignant neoplasm of skin: Secondary | ICD-10-CM | POA: Diagnosis not present

## 2023-07-08 DIAGNOSIS — B9620 Unspecified Escherichia coli [E. coli] as the cause of diseases classified elsewhere: Secondary | ICD-10-CM | POA: Diagnosis not present

## 2023-07-08 DIAGNOSIS — L989 Disorder of the skin and subcutaneous tissue, unspecified: Secondary | ICD-10-CM | POA: Diagnosis not present

## 2023-07-08 DIAGNOSIS — Z741 Need for assistance with personal care: Secondary | ICD-10-CM | POA: Diagnosis not present

## 2023-07-08 DIAGNOSIS — M109 Gout, unspecified: Secondary | ICD-10-CM | POA: Diagnosis not present

## 2023-07-08 DIAGNOSIS — I441 Atrioventricular block, second degree: Secondary | ICD-10-CM | POA: Diagnosis not present

## 2023-07-08 DIAGNOSIS — E785 Hyperlipidemia, unspecified: Secondary | ICD-10-CM | POA: Diagnosis not present

## 2023-07-08 DIAGNOSIS — R634 Abnormal weight loss: Secondary | ICD-10-CM | POA: Diagnosis not present

## 2023-07-08 DIAGNOSIS — H409 Unspecified glaucoma: Secondary | ICD-10-CM | POA: Diagnosis not present

## 2023-07-08 DIAGNOSIS — K869 Disease of pancreas, unspecified: Secondary | ICD-10-CM | POA: Diagnosis not present

## 2023-07-08 DIAGNOSIS — Z7401 Bed confinement status: Secondary | ICD-10-CM | POA: Diagnosis not present

## 2023-07-08 DIAGNOSIS — I2510 Atherosclerotic heart disease of native coronary artery without angina pectoris: Secondary | ICD-10-CM | POA: Diagnosis not present

## 2023-07-08 DIAGNOSIS — K5900 Constipation, unspecified: Secondary | ICD-10-CM | POA: Diagnosis not present

## 2023-07-08 DIAGNOSIS — Z66 Do not resuscitate: Secondary | ICD-10-CM | POA: Diagnosis not present

## 2023-07-08 DIAGNOSIS — R001 Bradycardia, unspecified: Secondary | ICD-10-CM | POA: Diagnosis not present

## 2023-07-08 DIAGNOSIS — R339 Retention of urine, unspecified: Secondary | ICD-10-CM | POA: Diagnosis not present

## 2023-07-08 DIAGNOSIS — I5022 Chronic systolic (congestive) heart failure: Secondary | ICD-10-CM | POA: Diagnosis not present

## 2023-07-08 DIAGNOSIS — K529 Noninfective gastroenteritis and colitis, unspecified: Secondary | ICD-10-CM | POA: Diagnosis not present

## 2023-07-08 DIAGNOSIS — I10 Essential (primary) hypertension: Secondary | ICD-10-CM | POA: Diagnosis not present

## 2023-07-08 DIAGNOSIS — R7989 Other specified abnormal findings of blood chemistry: Secondary | ICD-10-CM | POA: Diagnosis not present

## 2023-07-08 DIAGNOSIS — I509 Heart failure, unspecified: Secondary | ICD-10-CM | POA: Diagnosis not present

## 2023-07-08 DIAGNOSIS — T83091A Other mechanical complication of indwelling urethral catheter, initial encounter: Secondary | ICD-10-CM | POA: Diagnosis not present

## 2023-07-08 DIAGNOSIS — E440 Moderate protein-calorie malnutrition: Secondary | ICD-10-CM | POA: Diagnosis not present

## 2023-07-08 DIAGNOSIS — R2681 Unsteadiness on feet: Secondary | ICD-10-CM | POA: Diagnosis not present

## 2023-07-15 DIAGNOSIS — K5289 Other specified noninfective gastroenteritis and colitis: Secondary | ICD-10-CM | POA: Diagnosis not present

## 2023-07-15 DIAGNOSIS — Z7401 Bed confinement status: Secondary | ICD-10-CM | POA: Diagnosis not present

## 2023-07-15 DIAGNOSIS — Z85828 Personal history of other malignant neoplasm of skin: Secondary | ICD-10-CM | POA: Diagnosis not present

## 2023-07-15 DIAGNOSIS — R7401 Elevation of levels of liver transaminase levels: Secondary | ICD-10-CM | POA: Diagnosis not present

## 2023-07-15 DIAGNOSIS — E039 Hypothyroidism, unspecified: Secondary | ICD-10-CM | POA: Diagnosis not present

## 2023-07-15 DIAGNOSIS — R2681 Unsteadiness on feet: Secondary | ICD-10-CM | POA: Diagnosis not present

## 2023-07-15 DIAGNOSIS — E119 Type 2 diabetes mellitus without complications: Secondary | ICD-10-CM | POA: Diagnosis not present

## 2023-07-15 DIAGNOSIS — R001 Bradycardia, unspecified: Secondary | ICD-10-CM | POA: Diagnosis not present

## 2023-07-15 DIAGNOSIS — E1169 Type 2 diabetes mellitus with other specified complication: Secondary | ICD-10-CM | POA: Diagnosis not present

## 2023-07-15 DIAGNOSIS — N1832 Chronic kidney disease, stage 3b: Secondary | ICD-10-CM | POA: Diagnosis not present

## 2023-07-15 DIAGNOSIS — I5022 Chronic systolic (congestive) heart failure: Secondary | ICD-10-CM | POA: Diagnosis not present

## 2023-07-15 DIAGNOSIS — I10 Essential (primary) hypertension: Secondary | ICD-10-CM | POA: Diagnosis not present

## 2023-07-15 DIAGNOSIS — Z741 Need for assistance with personal care: Secondary | ICD-10-CM | POA: Diagnosis not present

## 2023-07-15 DIAGNOSIS — K869 Disease of pancreas, unspecified: Secondary | ICD-10-CM | POA: Diagnosis not present

## 2023-07-15 DIAGNOSIS — Z950 Presence of cardiac pacemaker: Secondary | ICD-10-CM | POA: Diagnosis not present

## 2023-07-15 DIAGNOSIS — Z45018 Encounter for adjustment and management of other part of cardiac pacemaker: Secondary | ICD-10-CM | POA: Diagnosis not present

## 2023-07-15 DIAGNOSIS — E785 Hyperlipidemia, unspecified: Secondary | ICD-10-CM | POA: Diagnosis not present

## 2023-07-15 DIAGNOSIS — K5900 Constipation, unspecified: Secondary | ICD-10-CM | POA: Diagnosis not present

## 2023-07-15 DIAGNOSIS — H409 Unspecified glaucoma: Secondary | ICD-10-CM | POA: Diagnosis not present

## 2023-07-15 DIAGNOSIS — I2510 Atherosclerotic heart disease of native coronary artery without angina pectoris: Secondary | ICD-10-CM | POA: Diagnosis not present

## 2023-07-15 DIAGNOSIS — E1122 Type 2 diabetes mellitus with diabetic chronic kidney disease: Secondary | ICD-10-CM | POA: Diagnosis not present

## 2023-07-15 DIAGNOSIS — R531 Weakness: Secondary | ICD-10-CM | POA: Diagnosis not present

## 2023-07-15 DIAGNOSIS — I130 Hypertensive heart and chronic kidney disease with heart failure and stage 1 through stage 4 chronic kidney disease, or unspecified chronic kidney disease: Secondary | ICD-10-CM | POA: Diagnosis not present

## 2023-07-15 DIAGNOSIS — E569 Vitamin deficiency, unspecified: Secondary | ICD-10-CM | POA: Diagnosis not present

## 2023-07-15 DIAGNOSIS — Z4502 Encounter for adjustment and management of automatic implantable cardiac defibrillator: Secondary | ICD-10-CM | POA: Diagnosis not present

## 2023-07-15 DIAGNOSIS — N189 Chronic kidney disease, unspecified: Secondary | ICD-10-CM | POA: Diagnosis not present

## 2023-07-15 DIAGNOSIS — L89150 Pressure ulcer of sacral region, unstageable: Secondary | ICD-10-CM | POA: Diagnosis not present

## 2023-07-15 DIAGNOSIS — R339 Retention of urine, unspecified: Secondary | ICD-10-CM | POA: Diagnosis not present

## 2023-07-15 DIAGNOSIS — M109 Gout, unspecified: Secondary | ICD-10-CM | POA: Diagnosis not present

## 2023-07-15 DIAGNOSIS — I509 Heart failure, unspecified: Secondary | ICD-10-CM | POA: Diagnosis not present

## 2023-07-15 DIAGNOSIS — B359 Dermatophytosis, unspecified: Secondary | ICD-10-CM | POA: Diagnosis not present

## 2023-07-15 DIAGNOSIS — M6259 Muscle wasting and atrophy, not elsewhere classified, multiple sites: Secondary | ICD-10-CM | POA: Diagnosis not present

## 2023-07-15 DIAGNOSIS — E113599 Type 2 diabetes mellitus with proliferative diabetic retinopathy without macular edema, unspecified eye: Secondary | ICD-10-CM | POA: Diagnosis not present

## 2023-07-15 DIAGNOSIS — M6250 Muscle wasting and atrophy, not elsewhere classified, unspecified site: Secondary | ICD-10-CM | POA: Diagnosis not present

## 2023-07-16 DIAGNOSIS — K869 Disease of pancreas, unspecified: Secondary | ICD-10-CM | POA: Diagnosis not present

## 2023-07-20 DIAGNOSIS — K5900 Constipation, unspecified: Secondary | ICD-10-CM | POA: Diagnosis not present

## 2023-07-21 DIAGNOSIS — I10 Essential (primary) hypertension: Secondary | ICD-10-CM | POA: Diagnosis not present

## 2023-07-22 DIAGNOSIS — E039 Hypothyroidism, unspecified: Secondary | ICD-10-CM | POA: Diagnosis not present

## 2023-07-22 DIAGNOSIS — R7401 Elevation of levels of liver transaminase levels: Secondary | ICD-10-CM | POA: Diagnosis not present

## 2023-07-22 DIAGNOSIS — R001 Bradycardia, unspecified: Secondary | ICD-10-CM | POA: Diagnosis not present

## 2023-07-22 DIAGNOSIS — E119 Type 2 diabetes mellitus without complications: Secondary | ICD-10-CM | POA: Diagnosis not present

## 2023-07-23 DIAGNOSIS — E119 Type 2 diabetes mellitus without complications: Secondary | ICD-10-CM | POA: Diagnosis not present

## 2023-07-23 DIAGNOSIS — E785 Hyperlipidemia, unspecified: Secondary | ICD-10-CM | POA: Diagnosis not present

## 2023-07-27 DIAGNOSIS — Z4502 Encounter for adjustment and management of automatic implantable cardiac defibrillator: Secondary | ICD-10-CM | POA: Diagnosis not present

## 2023-07-27 DIAGNOSIS — Z45018 Encounter for adjustment and management of other part of cardiac pacemaker: Secondary | ICD-10-CM | POA: Diagnosis not present

## 2023-07-28 DIAGNOSIS — L89150 Pressure ulcer of sacral region, unstageable: Secondary | ICD-10-CM | POA: Diagnosis not present

## 2023-07-29 DIAGNOSIS — N1832 Chronic kidney disease, stage 3b: Secondary | ICD-10-CM | POA: Diagnosis not present

## 2023-07-29 DIAGNOSIS — I130 Hypertensive heart and chronic kidney disease with heart failure and stage 1 through stage 4 chronic kidney disease, or unspecified chronic kidney disease: Secondary | ICD-10-CM | POA: Diagnosis not present

## 2023-07-29 DIAGNOSIS — E119 Type 2 diabetes mellitus without complications: Secondary | ICD-10-CM | POA: Diagnosis not present

## 2023-07-29 DIAGNOSIS — E785 Hyperlipidemia, unspecified: Secondary | ICD-10-CM | POA: Diagnosis not present

## 2023-07-29 DIAGNOSIS — K5289 Other specified noninfective gastroenteritis and colitis: Secondary | ICD-10-CM | POA: Diagnosis not present

## 2023-07-29 DIAGNOSIS — E1122 Type 2 diabetes mellitus with diabetic chronic kidney disease: Secondary | ICD-10-CM | POA: Diagnosis not present

## 2023-07-29 DIAGNOSIS — E113599 Type 2 diabetes mellitus with proliferative diabetic retinopathy without macular edema, unspecified eye: Secondary | ICD-10-CM | POA: Diagnosis not present

## 2023-07-29 DIAGNOSIS — I509 Heart failure, unspecified: Secondary | ICD-10-CM | POA: Diagnosis not present

## 2023-07-29 DIAGNOSIS — E039 Hypothyroidism, unspecified: Secondary | ICD-10-CM | POA: Diagnosis not present

## 2023-07-29 DIAGNOSIS — E1169 Type 2 diabetes mellitus with other specified complication: Secondary | ICD-10-CM | POA: Diagnosis not present

## 2023-08-06 DIAGNOSIS — H409 Unspecified glaucoma: Secondary | ICD-10-CM | POA: Diagnosis not present

## 2023-08-06 DIAGNOSIS — R2681 Unsteadiness on feet: Secondary | ICD-10-CM | POA: Diagnosis not present

## 2023-08-06 DIAGNOSIS — E119 Type 2 diabetes mellitus without complications: Secondary | ICD-10-CM | POA: Diagnosis not present

## 2023-08-06 DIAGNOSIS — H3530 Unspecified macular degeneration: Secondary | ICD-10-CM | POA: Diagnosis not present

## 2023-08-06 DIAGNOSIS — R040 Epistaxis: Secondary | ICD-10-CM | POA: Diagnosis not present

## 2023-08-06 DIAGNOSIS — E039 Hypothyroidism, unspecified: Secondary | ICD-10-CM | POA: Diagnosis not present

## 2023-08-06 DIAGNOSIS — C434 Malignant melanoma of scalp and neck: Secondary | ICD-10-CM | POA: Diagnosis not present

## 2023-08-13 DIAGNOSIS — E119 Type 2 diabetes mellitus without complications: Secondary | ICD-10-CM | POA: Diagnosis not present

## 2023-08-13 DIAGNOSIS — E039 Hypothyroidism, unspecified: Secondary | ICD-10-CM | POA: Diagnosis not present

## 2023-08-16 DIAGNOSIS — Z79899 Other long term (current) drug therapy: Secondary | ICD-10-CM | POA: Diagnosis not present

## 2023-08-16 DIAGNOSIS — E559 Vitamin D deficiency, unspecified: Secondary | ICD-10-CM | POA: Diagnosis not present

## 2023-08-25 DIAGNOSIS — K5900 Constipation, unspecified: Secondary | ICD-10-CM | POA: Diagnosis not present

## 2023-08-25 DIAGNOSIS — R748 Abnormal levels of other serum enzymes: Secondary | ICD-10-CM | POA: Diagnosis not present

## 2023-08-25 DIAGNOSIS — R2681 Unsteadiness on feet: Secondary | ICD-10-CM | POA: Diagnosis not present

## 2023-08-25 DIAGNOSIS — E1165 Type 2 diabetes mellitus with hyperglycemia: Secondary | ICD-10-CM | POA: Diagnosis not present

## 2023-08-25 DIAGNOSIS — D649 Anemia, unspecified: Secondary | ICD-10-CM | POA: Diagnosis not present

## 2023-08-25 DIAGNOSIS — R040 Epistaxis: Secondary | ICD-10-CM | POA: Diagnosis not present

## 2023-08-25 DIAGNOSIS — E039 Hypothyroidism, unspecified: Secondary | ICD-10-CM | POA: Diagnosis not present

## 2023-08-25 DIAGNOSIS — E538 Deficiency of other specified B group vitamins: Secondary | ICD-10-CM | POA: Diagnosis not present

## 2023-08-25 DIAGNOSIS — N1831 Chronic kidney disease, stage 3a: Secondary | ICD-10-CM | POA: Diagnosis not present

## 2023-08-30 DIAGNOSIS — H401134 Primary open-angle glaucoma, bilateral, indeterminate stage: Secondary | ICD-10-CM | POA: Diagnosis not present

## 2023-08-30 DIAGNOSIS — E113593 Type 2 diabetes mellitus with proliferative diabetic retinopathy without macular edema, bilateral: Secondary | ICD-10-CM | POA: Diagnosis not present

## 2023-08-30 DIAGNOSIS — H26491 Other secondary cataract, right eye: Secondary | ICD-10-CM | POA: Diagnosis not present

## 2023-08-30 DIAGNOSIS — Z961 Presence of intraocular lens: Secondary | ICD-10-CM | POA: Diagnosis not present

## 2023-09-01 DIAGNOSIS — R748 Abnormal levels of other serum enzymes: Secondary | ICD-10-CM | POA: Diagnosis not present

## 2023-09-01 DIAGNOSIS — M25561 Pain in right knee: Secondary | ICD-10-CM | POA: Diagnosis not present

## 2023-09-01 DIAGNOSIS — M109 Gout, unspecified: Secondary | ICD-10-CM | POA: Diagnosis not present

## 2023-09-01 DIAGNOSIS — R2681 Unsteadiness on feet: Secondary | ICD-10-CM | POA: Diagnosis not present

## 2023-09-01 DIAGNOSIS — E119 Type 2 diabetes mellitus without complications: Secondary | ICD-10-CM | POA: Diagnosis not present

## 2023-09-01 DIAGNOSIS — D649 Anemia, unspecified: Secondary | ICD-10-CM | POA: Diagnosis not present

## 2023-09-01 DIAGNOSIS — N1831 Chronic kidney disease, stage 3a: Secondary | ICD-10-CM | POA: Diagnosis not present

## 2023-09-01 DIAGNOSIS — M25562 Pain in left knee: Secondary | ICD-10-CM | POA: Diagnosis not present

## 2023-09-08 DIAGNOSIS — M2550 Pain in unspecified joint: Secondary | ICD-10-CM | POA: Diagnosis not present

## 2023-09-08 DIAGNOSIS — M109 Gout, unspecified: Secondary | ICD-10-CM | POA: Diagnosis not present

## 2023-09-08 DIAGNOSIS — R2681 Unsteadiness on feet: Secondary | ICD-10-CM | POA: Diagnosis not present

## 2023-09-13 DIAGNOSIS — E119 Type 2 diabetes mellitus without complications: Secondary | ICD-10-CM | POA: Diagnosis not present

## 2023-09-13 DIAGNOSIS — M159 Polyosteoarthritis, unspecified: Secondary | ICD-10-CM | POA: Diagnosis not present

## 2023-09-22 DIAGNOSIS — M109 Gout, unspecified: Secondary | ICD-10-CM | POA: Diagnosis not present

## 2023-09-22 DIAGNOSIS — L309 Dermatitis, unspecified: Secondary | ICD-10-CM | POA: Diagnosis not present

## 2023-09-22 DIAGNOSIS — M159 Polyosteoarthritis, unspecified: Secondary | ICD-10-CM | POA: Diagnosis not present

## 2023-09-22 DIAGNOSIS — R2681 Unsteadiness on feet: Secondary | ICD-10-CM | POA: Diagnosis not present

## 2023-09-22 DIAGNOSIS — N1831 Chronic kidney disease, stage 3a: Secondary | ICD-10-CM | POA: Diagnosis not present

## 2023-10-07 DIAGNOSIS — E785 Hyperlipidemia, unspecified: Secondary | ICD-10-CM | POA: Diagnosis not present

## 2023-10-07 DIAGNOSIS — Z515 Encounter for palliative care: Secondary | ICD-10-CM | POA: Diagnosis not present

## 2023-10-07 DIAGNOSIS — H401130 Primary open-angle glaucoma, bilateral, stage unspecified: Secondary | ICD-10-CM | POA: Diagnosis not present

## 2023-10-07 DIAGNOSIS — M109 Gout, unspecified: Secondary | ICD-10-CM | POA: Diagnosis not present

## 2023-10-07 DIAGNOSIS — K5900 Constipation, unspecified: Secondary | ICD-10-CM | POA: Diagnosis not present

## 2023-10-07 DIAGNOSIS — E1165 Type 2 diabetes mellitus with hyperglycemia: Secondary | ICD-10-CM | POA: Diagnosis not present

## 2023-10-07 DIAGNOSIS — G8929 Other chronic pain: Secondary | ICD-10-CM | POA: Diagnosis not present

## 2023-10-07 DIAGNOSIS — E039 Hypothyroidism, unspecified: Secondary | ICD-10-CM | POA: Diagnosis not present

## 2023-10-07 DIAGNOSIS — E559 Vitamin D deficiency, unspecified: Secondary | ICD-10-CM | POA: Diagnosis not present

## 2023-10-11 DIAGNOSIS — H401134 Primary open-angle glaucoma, bilateral, indeterminate stage: Secondary | ICD-10-CM | POA: Diagnosis not present

## 2023-10-12 ENCOUNTER — Other Ambulatory Visit: Payer: Self-pay | Admitting: Registered Nurse

## 2023-10-12 DIAGNOSIS — M2550 Pain in unspecified joint: Secondary | ICD-10-CM

## 2023-10-12 DIAGNOSIS — M158 Other polyosteoarthritis: Secondary | ICD-10-CM

## 2023-10-13 DIAGNOSIS — E785 Hyperlipidemia, unspecified: Secondary | ICD-10-CM | POA: Diagnosis not present

## 2023-10-13 DIAGNOSIS — G8929 Other chronic pain: Secondary | ICD-10-CM | POA: Diagnosis not present

## 2023-10-19 DIAGNOSIS — I441 Atrioventricular block, second degree: Secondary | ICD-10-CM | POA: Diagnosis not present

## 2023-10-29 ENCOUNTER — Other Ambulatory Visit: Payer: Self-pay | Admitting: Registered Nurse

## 2023-10-29 DIAGNOSIS — M158 Other polyosteoarthritis: Secondary | ICD-10-CM

## 2023-10-29 DIAGNOSIS — M2550 Pain in unspecified joint: Secondary | ICD-10-CM

## 2023-11-01 DIAGNOSIS — I10 Essential (primary) hypertension: Secondary | ICD-10-CM | POA: Diagnosis not present

## 2023-11-01 DIAGNOSIS — I255 Ischemic cardiomyopathy: Secondary | ICD-10-CM | POA: Diagnosis not present

## 2023-11-01 DIAGNOSIS — I2510 Atherosclerotic heart disease of native coronary artery without angina pectoris: Secondary | ICD-10-CM | POA: Diagnosis not present

## 2023-11-03 DIAGNOSIS — E785 Hyperlipidemia, unspecified: Secondary | ICD-10-CM | POA: Diagnosis not present

## 2023-11-03 DIAGNOSIS — G8929 Other chronic pain: Secondary | ICD-10-CM | POA: Diagnosis not present

## 2023-11-17 DIAGNOSIS — I10 Essential (primary) hypertension: Secondary | ICD-10-CM | POA: Diagnosis not present

## 2023-11-17 DIAGNOSIS — E039 Hypothyroidism, unspecified: Secondary | ICD-10-CM | POA: Diagnosis not present

## 2023-11-17 DIAGNOSIS — Z79899 Other long term (current) drug therapy: Secondary | ICD-10-CM | POA: Diagnosis not present

## 2023-11-24 DIAGNOSIS — E785 Hyperlipidemia, unspecified: Secondary | ICD-10-CM | POA: Diagnosis not present

## 2023-11-24 DIAGNOSIS — I255 Ischemic cardiomyopathy: Secondary | ICD-10-CM | POA: Diagnosis not present

## 2023-11-24 DIAGNOSIS — I10 Essential (primary) hypertension: Secondary | ICD-10-CM | POA: Diagnosis not present

## 2023-11-24 DIAGNOSIS — E1169 Type 2 diabetes mellitus with other specified complication: Secondary | ICD-10-CM | POA: Diagnosis not present

## 2023-11-24 DIAGNOSIS — I2510 Atherosclerotic heart disease of native coronary artery without angina pectoris: Secondary | ICD-10-CM | POA: Diagnosis not present

## 2023-11-24 DIAGNOSIS — Z9889 Other specified postprocedural states: Secondary | ICD-10-CM | POA: Diagnosis not present

## 2023-11-24 DIAGNOSIS — I5022 Chronic systolic (congestive) heart failure: Secondary | ICD-10-CM | POA: Diagnosis not present

## 2023-11-24 DIAGNOSIS — N183 Chronic kidney disease, stage 3 unspecified: Secondary | ICD-10-CM | POA: Diagnosis not present

## 2023-11-24 DIAGNOSIS — E1122 Type 2 diabetes mellitus with diabetic chronic kidney disease: Secondary | ICD-10-CM | POA: Diagnosis not present

## 2023-11-24 DIAGNOSIS — I441 Atrioventricular block, second degree: Secondary | ICD-10-CM | POA: Diagnosis not present

## 2023-11-24 DIAGNOSIS — Z950 Presence of cardiac pacemaker: Secondary | ICD-10-CM | POA: Diagnosis not present

## 2023-12-08 DIAGNOSIS — G8929 Other chronic pain: Secondary | ICD-10-CM | POA: Diagnosis not present

## 2023-12-08 DIAGNOSIS — E785 Hyperlipidemia, unspecified: Secondary | ICD-10-CM | POA: Diagnosis not present

## 2023-12-13 DIAGNOSIS — B351 Tinea unguium: Secondary | ICD-10-CM | POA: Diagnosis not present

## 2023-12-13 DIAGNOSIS — E1159 Type 2 diabetes mellitus with other circulatory complications: Secondary | ICD-10-CM | POA: Diagnosis not present

## 2023-12-13 DIAGNOSIS — M2042 Other hammer toe(s) (acquired), left foot: Secondary | ICD-10-CM | POA: Diagnosis not present

## 2023-12-13 DIAGNOSIS — M2041 Other hammer toe(s) (acquired), right foot: Secondary | ICD-10-CM | POA: Diagnosis not present

## 2023-12-20 ENCOUNTER — Ambulatory Visit
Admission: RE | Admit: 2023-12-20 | Discharge: 2023-12-20 | Disposition: A | Discharge status: Home / Self Care | Source: Ambulatory Visit | Attending: Registered Nurse | Admitting: Registered Nurse

## 2023-12-20 DIAGNOSIS — Z13820 Encounter for screening for osteoporosis: Secondary | ICD-10-CM | POA: Diagnosis not present

## 2023-12-20 DIAGNOSIS — M158 Other polyosteoarthritis: Secondary | ICD-10-CM | POA: Diagnosis not present

## 2023-12-20 DIAGNOSIS — M2550 Pain in unspecified joint: Secondary | ICD-10-CM | POA: Diagnosis not present

## 2023-12-20 DIAGNOSIS — Z780 Asymptomatic menopausal state: Secondary | ICD-10-CM | POA: Diagnosis not present

## 2023-12-20 DIAGNOSIS — M810 Age-related osteoporosis without current pathological fracture: Secondary | ICD-10-CM | POA: Insufficient documentation

## 2023-12-22 DIAGNOSIS — H401130 Primary open-angle glaucoma, bilateral, stage unspecified: Secondary | ICD-10-CM | POA: Diagnosis not present

## 2023-12-22 DIAGNOSIS — M159 Polyosteoarthritis, unspecified: Secondary | ICD-10-CM | POA: Diagnosis not present

## 2023-12-22 DIAGNOSIS — I441 Atrioventricular block, second degree: Secondary | ICD-10-CM | POA: Diagnosis not present

## 2023-12-22 DIAGNOSIS — I255 Ischemic cardiomyopathy: Secondary | ICD-10-CM | POA: Diagnosis not present

## 2023-12-22 DIAGNOSIS — Z950 Presence of cardiac pacemaker: Secondary | ICD-10-CM | POA: Diagnosis not present

## 2023-12-22 DIAGNOSIS — R269 Unspecified abnormalities of gait and mobility: Secondary | ICD-10-CM | POA: Diagnosis not present

## 2023-12-22 DIAGNOSIS — Z79899 Other long term (current) drug therapy: Secondary | ICD-10-CM | POA: Diagnosis not present

## 2023-12-22 DIAGNOSIS — N1832 Chronic kidney disease, stage 3b: Secondary | ICD-10-CM | POA: Diagnosis not present

## 2023-12-22 DIAGNOSIS — J302 Other seasonal allergic rhinitis: Secondary | ICD-10-CM | POA: Diagnosis not present

## 2024-01-07 DIAGNOSIS — H401130 Primary open-angle glaucoma, bilateral, stage unspecified: Secondary | ICD-10-CM | POA: Diagnosis not present

## 2024-01-07 DIAGNOSIS — M159 Polyosteoarthritis, unspecified: Secondary | ICD-10-CM | POA: Diagnosis not present

## 2024-01-07 DIAGNOSIS — R269 Unspecified abnormalities of gait and mobility: Secondary | ICD-10-CM | POA: Diagnosis not present

## 2024-01-07 DIAGNOSIS — I441 Atrioventricular block, second degree: Secondary | ICD-10-CM | POA: Diagnosis not present

## 2024-01-07 DIAGNOSIS — E1122 Type 2 diabetes mellitus with diabetic chronic kidney disease: Secondary | ICD-10-CM | POA: Diagnosis not present

## 2024-01-07 DIAGNOSIS — J302 Other seasonal allergic rhinitis: Secondary | ICD-10-CM | POA: Diagnosis not present

## 2024-01-07 DIAGNOSIS — N1832 Chronic kidney disease, stage 3b: Secondary | ICD-10-CM | POA: Diagnosis not present

## 2024-01-07 DIAGNOSIS — I255 Ischemic cardiomyopathy: Secondary | ICD-10-CM | POA: Diagnosis not present

## 2024-01-07 DIAGNOSIS — Z950 Presence of cardiac pacemaker: Secondary | ICD-10-CM | POA: Diagnosis not present

## 2024-01-07 DIAGNOSIS — E039 Hypothyroidism, unspecified: Secondary | ICD-10-CM | POA: Diagnosis not present

## 2024-01-10 DIAGNOSIS — G8929 Other chronic pain: Secondary | ICD-10-CM | POA: Diagnosis not present

## 2024-01-10 DIAGNOSIS — H401134 Primary open-angle glaucoma, bilateral, indeterminate stage: Secondary | ICD-10-CM | POA: Diagnosis not present

## 2024-01-10 DIAGNOSIS — E785 Hyperlipidemia, unspecified: Secondary | ICD-10-CM | POA: Diagnosis not present

## 2024-01-19 ENCOUNTER — Other Ambulatory Visit: Payer: Self-pay | Admitting: Internal Medicine

## 2024-01-19 DIAGNOSIS — E038 Other specified hypothyroidism: Secondary | ICD-10-CM | POA: Diagnosis not present

## 2024-01-19 DIAGNOSIS — N183 Chronic kidney disease, stage 3 unspecified: Secondary | ICD-10-CM | POA: Diagnosis not present

## 2024-01-19 DIAGNOSIS — E1122 Type 2 diabetes mellitus with diabetic chronic kidney disease: Secondary | ICD-10-CM | POA: Diagnosis not present

## 2024-01-19 DIAGNOSIS — I10 Essential (primary) hypertension: Secondary | ICD-10-CM | POA: Diagnosis not present

## 2024-01-19 DIAGNOSIS — Z1231 Encounter for screening mammogram for malignant neoplasm of breast: Secondary | ICD-10-CM

## 2024-01-19 DIAGNOSIS — E1169 Type 2 diabetes mellitus with other specified complication: Secondary | ICD-10-CM | POA: Diagnosis not present

## 2024-01-19 DIAGNOSIS — E785 Hyperlipidemia, unspecified: Secondary | ICD-10-CM | POA: Diagnosis not present

## 2024-01-19 DIAGNOSIS — D510 Vitamin B12 deficiency anemia due to intrinsic factor deficiency: Secondary | ICD-10-CM | POA: Diagnosis not present

## 2024-01-19 DIAGNOSIS — N1832 Chronic kidney disease, stage 3b: Secondary | ICD-10-CM | POA: Diagnosis not present

## 2024-01-19 DIAGNOSIS — K862 Cyst of pancreas: Secondary | ICD-10-CM | POA: Diagnosis not present

## 2024-01-25 ENCOUNTER — Other Ambulatory Visit: Payer: Self-pay | Admitting: Internal Medicine

## 2024-01-25 DIAGNOSIS — K862 Cyst of pancreas: Secondary | ICD-10-CM

## 2024-02-02 ENCOUNTER — Other Ambulatory Visit: Payer: Self-pay | Admitting: Internal Medicine

## 2024-02-02 DIAGNOSIS — K862 Cyst of pancreas: Secondary | ICD-10-CM

## 2024-02-04 ENCOUNTER — Ambulatory Visit
Admission: RE | Admit: 2024-02-04 | Discharge: 2024-02-04 | Disposition: A | Discharge status: Home / Self Care | Source: Ambulatory Visit | Attending: Internal Medicine | Admitting: Internal Medicine

## 2024-02-04 DIAGNOSIS — Z1231 Encounter for screening mammogram for malignant neoplasm of breast: Secondary | ICD-10-CM | POA: Diagnosis not present

## 2024-02-10 ENCOUNTER — Other Ambulatory Visit

## 2024-02-11 ENCOUNTER — Ambulatory Visit
Admission: RE | Admit: 2024-02-11 | Discharge: 2024-02-11 | Disposition: A | Discharge status: Home / Self Care | Source: Ambulatory Visit | Attending: Internal Medicine | Admitting: Internal Medicine

## 2024-02-11 DIAGNOSIS — K8689 Other specified diseases of pancreas: Secondary | ICD-10-CM | POA: Diagnosis not present

## 2024-02-11 DIAGNOSIS — K8020 Calculus of gallbladder without cholecystitis without obstruction: Secondary | ICD-10-CM | POA: Diagnosis not present

## 2024-02-11 DIAGNOSIS — K862 Cyst of pancreas: Secondary | ICD-10-CM | POA: Diagnosis not present

## 2024-02-23 DIAGNOSIS — I5022 Chronic systolic (congestive) heart failure: Secondary | ICD-10-CM | POA: Diagnosis not present

## 2024-02-23 DIAGNOSIS — E1169 Type 2 diabetes mellitus with other specified complication: Secondary | ICD-10-CM | POA: Diagnosis not present

## 2024-02-23 DIAGNOSIS — I10 Essential (primary) hypertension: Secondary | ICD-10-CM | POA: Diagnosis not present

## 2024-02-23 DIAGNOSIS — N183 Chronic kidney disease, stage 3 unspecified: Secondary | ICD-10-CM | POA: Diagnosis not present

## 2024-02-23 DIAGNOSIS — E785 Hyperlipidemia, unspecified: Secondary | ICD-10-CM | POA: Diagnosis not present

## 2024-02-23 DIAGNOSIS — Z9889 Other specified postprocedural states: Secondary | ICD-10-CM | POA: Diagnosis not present

## 2024-02-23 DIAGNOSIS — E1122 Type 2 diabetes mellitus with diabetic chronic kidney disease: Secondary | ICD-10-CM | POA: Diagnosis not present

## 2024-02-23 DIAGNOSIS — I255 Ischemic cardiomyopathy: Secondary | ICD-10-CM | POA: Diagnosis not present

## 2024-02-23 DIAGNOSIS — I2510 Atherosclerotic heart disease of native coronary artery without angina pectoris: Secondary | ICD-10-CM | POA: Diagnosis not present

## 2024-03-03 DIAGNOSIS — I2510 Atherosclerotic heart disease of native coronary artery without angina pectoris: Secondary | ICD-10-CM | POA: Diagnosis not present

## 2024-03-03 DIAGNOSIS — I255 Ischemic cardiomyopathy: Secondary | ICD-10-CM | POA: Diagnosis not present

## 2024-03-03 DIAGNOSIS — I5022 Chronic systolic (congestive) heart failure: Secondary | ICD-10-CM | POA: Diagnosis not present

## 2024-04-12 DIAGNOSIS — E1122 Type 2 diabetes mellitus with diabetic chronic kidney disease: Secondary | ICD-10-CM | POA: Diagnosis not present

## 2024-04-12 DIAGNOSIS — I2510 Atherosclerotic heart disease of native coronary artery without angina pectoris: Secondary | ICD-10-CM | POA: Diagnosis not present

## 2024-04-12 DIAGNOSIS — I10 Essential (primary) hypertension: Secondary | ICD-10-CM | POA: Diagnosis not present

## 2024-04-12 DIAGNOSIS — E1169 Type 2 diabetes mellitus with other specified complication: Secondary | ICD-10-CM | POA: Diagnosis not present

## 2024-04-12 DIAGNOSIS — E785 Hyperlipidemia, unspecified: Secondary | ICD-10-CM | POA: Diagnosis not present

## 2024-04-12 DIAGNOSIS — I255 Ischemic cardiomyopathy: Secondary | ICD-10-CM | POA: Diagnosis not present

## 2024-04-12 DIAGNOSIS — N183 Chronic kidney disease, stage 3 unspecified: Secondary | ICD-10-CM | POA: Diagnosis not present

## 2024-04-12 DIAGNOSIS — Z950 Presence of cardiac pacemaker: Secondary | ICD-10-CM | POA: Diagnosis not present

## 2024-04-12 DIAGNOSIS — I441 Atrioventricular block, second degree: Secondary | ICD-10-CM | POA: Diagnosis not present

## 2024-05-15 DIAGNOSIS — L821 Other seborrheic keratosis: Secondary | ICD-10-CM | POA: Diagnosis not present

## 2024-05-15 DIAGNOSIS — L814 Other melanin hyperpigmentation: Secondary | ICD-10-CM | POA: Diagnosis not present

## 2024-05-15 DIAGNOSIS — L988 Other specified disorders of the skin and subcutaneous tissue: Secondary | ICD-10-CM | POA: Diagnosis not present

## 2024-05-15 DIAGNOSIS — D229 Melanocytic nevi, unspecified: Secondary | ICD-10-CM | POA: Diagnosis not present

## 2024-05-15 DIAGNOSIS — B354 Tinea corporis: Secondary | ICD-10-CM | POA: Diagnosis not present

## 2024-05-15 DIAGNOSIS — Z85828 Personal history of other malignant neoplasm of skin: Secondary | ICD-10-CM | POA: Diagnosis not present

## 2024-05-15 DIAGNOSIS — Z86006 Personal history of melanoma in-situ: Secondary | ICD-10-CM | POA: Diagnosis not present

## 2024-05-23 DIAGNOSIS — R21 Rash and other nonspecific skin eruption: Secondary | ICD-10-CM | POA: Diagnosis not present
